# Patient Record
Sex: Male | Born: 1980 | Hispanic: Yes | Marital: Single | Smoking: Never smoker
Health system: Southern US, Community
[De-identification: ages and names within clinical notes are randomized; demographics above are authoritative.]

---

## 2019-10-18 ENCOUNTER — Emergency Department (HOSPITAL_COMMUNITY): Payer: Self-pay

## 2019-10-18 ENCOUNTER — Other Ambulatory Visit: Payer: Self-pay

## 2019-10-18 ENCOUNTER — Emergency Department (HOSPITAL_COMMUNITY)
Admission: EM | Admit: 2019-10-18 | Discharge: 2019-10-19 | Disposition: A | Discharge status: Home / Self Care | Payer: Self-pay | Attending: Emergency Medicine | Admitting: Emergency Medicine

## 2019-10-18 DIAGNOSIS — K863 Pseudocyst of pancreas: Secondary | ICD-10-CM | POA: Insufficient documentation

## 2019-10-18 DIAGNOSIS — Y999 Unspecified external cause status: Secondary | ICD-10-CM | POA: Insufficient documentation

## 2019-10-18 DIAGNOSIS — F10920 Alcohol use, unspecified with intoxication, uncomplicated: Secondary | ICD-10-CM | POA: Insufficient documentation

## 2019-10-18 DIAGNOSIS — R000 Tachycardia, unspecified: Secondary | ICD-10-CM | POA: Insufficient documentation

## 2019-10-18 DIAGNOSIS — Y939 Activity, unspecified: Secondary | ICD-10-CM | POA: Insufficient documentation

## 2019-10-18 DIAGNOSIS — Y908 Blood alcohol level of 240 mg/100 ml or more: Secondary | ICD-10-CM | POA: Insufficient documentation

## 2019-10-18 DIAGNOSIS — S0181XA Laceration without foreign body of other part of head, initial encounter: Secondary | ICD-10-CM | POA: Insufficient documentation

## 2019-10-18 DIAGNOSIS — R519 Headache, unspecified: Secondary | ICD-10-CM | POA: Insufficient documentation

## 2019-10-18 DIAGNOSIS — Z23 Encounter for immunization: Secondary | ICD-10-CM | POA: Insufficient documentation

## 2019-10-18 DIAGNOSIS — M542 Cervicalgia: Secondary | ICD-10-CM | POA: Insufficient documentation

## 2019-10-18 DIAGNOSIS — Y929 Unspecified place or not applicable: Secondary | ICD-10-CM | POA: Insufficient documentation

## 2019-10-18 LAB — I-STAT CHEM 8, ED
BUN: 6 mg/dL (ref 6–20)
Calcium, Ion: 0.93 mmol/L — ABNORMAL LOW (ref 1.15–1.40)
Chloride: 95 mmol/L — ABNORMAL LOW (ref 98–111)
Creatinine, Ser: 1.3 mg/dL — ABNORMAL HIGH (ref 0.61–1.24)
Glucose, Bld: 410 mg/dL — ABNORMAL HIGH (ref 70–99)
HCT: 47 % (ref 39.0–52.0)
Hemoglobin: 16 g/dL (ref 13.0–17.0)
Potassium: 3.8 mmol/L (ref 3.5–5.1)
Sodium: 132 mmol/L — ABNORMAL LOW (ref 135–145)
TCO2: 24 mmol/L (ref 22–32)

## 2019-10-18 LAB — COMPREHENSIVE METABOLIC PANEL
ALT: 71 U/L — ABNORMAL HIGH (ref 0–44)
AST: 95 U/L — ABNORMAL HIGH (ref 15–41)
Albumin: 4.5 g/dL (ref 3.5–5.0)
Alkaline Phosphatase: 81 U/L (ref 38–126)
Anion gap: 21 — ABNORMAL HIGH (ref 5–15)
BUN: 7 mg/dL (ref 6–20)
CO2: 21 mmol/L — ABNORMAL LOW (ref 22–32)
Calcium: 9 mg/dL (ref 8.9–10.3)
Chloride: 91 mmol/L — ABNORMAL LOW (ref 98–111)
Creatinine, Ser: 0.83 mg/dL (ref 0.61–1.24)
GFR calc Af Amer: >60 mL/min (ref >60)
GFR calc non Af Amer: >60 mL/min (ref >60)
Glucose, Bld: 413 mg/dL — ABNORMAL HIGH (ref 70–99)
Potassium: 4 mmol/L (ref 3.5–5.1)
Sodium: 133 mmol/L — ABNORMAL LOW (ref 135–145)
Total Bilirubin: 0.8 mg/dL (ref 0.3–1.2)
Total Protein: 7.4 g/dL (ref 6.5–8.1)

## 2019-10-18 LAB — CBC WITH DIFFERENTIAL/PLATELET
Abs Immature Granulocytes: 0.04 10*3/uL (ref 0.00–0.07)
Basophils Absolute: 0 10*3/uL (ref 0.0–0.1)
Basophils Relative: 1 %
Eosinophils Absolute: 0 10*3/uL (ref 0.0–0.5)
Eosinophils Relative: 0 %
HCT: 44.7 % (ref 39.0–52.0)
Hemoglobin: 15.4 g/dL (ref 13.0–17.0)
Immature Granulocytes: 1 %
Lymphocytes Relative: 41 %
Lymphs Abs: 2.1 10*3/uL (ref 0.7–4.0)
MCH: 31.3 pg (ref 26.0–34.0)
MCHC: 34.5 g/dL (ref 30.0–36.0)
MCV: 90.9 fL (ref 80.0–100.0)
Monocytes Absolute: 0.4 10*3/uL (ref 0.1–1.0)
Monocytes Relative: 7 %
Neutro Abs: 2.6 10*3/uL (ref 1.7–7.7)
Neutrophils Relative %: 50 %
Platelets: 147 10*3/uL — ABNORMAL LOW (ref 150–400)
RBC: 4.92 MIL/uL (ref 4.22–5.81)
RDW: 12.8 % (ref 11.5–15.5)
WBC: 5.1 10*3/uL (ref 4.0–10.5)
nRBC: 0 % (ref 0.0–0.2)

## 2019-10-18 LAB — URINALYSIS, ROUTINE W REFLEX MICROSCOPIC
Bacteria, UA: NONE SEEN
Bilirubin Urine: NEGATIVE
Glucose, UA: >=500 mg/dL — AB
Ketones, ur: 5 mg/dL — AB
Leukocytes,Ua: NEGATIVE
Nitrite: NEGATIVE
Protein, ur: 30 mg/dL — AB
Specific Gravity, Urine: 1.026 (ref 1.005–1.030)
pH: 6 (ref 5.0–8.0)

## 2019-10-18 LAB — ABO/RH: ABO/RH(D): A POS

## 2019-10-18 LAB — RAPID URINE DRUG SCREEN, HOSP PERFORMED
Amphetamines: NOT DETECTED
Barbiturates: NOT DETECTED
Benzodiazepines: NOT DETECTED
Cocaine: NOT DETECTED
Opiates: NOT DETECTED
Tetrahydrocannabinol: NOT DETECTED

## 2019-10-18 LAB — PROTIME-INR
INR: 0.9 (ref 0.8–1.2)
Prothrombin Time: 12.5 seconds (ref 11.4–15.2)

## 2019-10-18 LAB — TYPE AND SCREEN
ABO/RH(D): A POS
Antibody Screen: NEGATIVE

## 2019-10-18 LAB — ETHANOL: Alcohol, Ethyl (B): 294 mg/dL — ABNORMAL HIGH (ref <10)

## 2019-10-18 LAB — APTT: aPTT: 26 seconds (ref 24–36)

## 2019-10-18 MED ORDER — LORAZEPAM 2 MG/ML IJ SOLN
1.0000 mg | Freq: Once | INTRAMUSCULAR | Status: AC
  Administered 2019-10-18: 1 mg via INTRAVENOUS
  Filled 2019-10-18: qty 1

## 2019-10-18 MED ORDER — LIDOCAINE-EPINEPHRINE (PF) 2 %-1:200000 IJ SOLN
20.0000 mL | Freq: Once | INTRAMUSCULAR | Status: AC
  Administered 2019-10-18: 20 mL
  Filled 2019-10-18: qty 20

## 2019-10-18 MED ORDER — SODIUM CHLORIDE 0.9 % IV BOLUS
1000.0000 mL | Freq: Once | INTRAVENOUS | Status: AC
  Administered 2019-10-18: 1000 mL via INTRAVENOUS

## 2019-10-18 MED ORDER — IOHEXOL 300 MG/ML  SOLN
100.0000 mL | Freq: Once | INTRAMUSCULAR | Status: AC | PRN
  Administered 2019-10-18: 100 mL via INTRAVENOUS

## 2019-10-18 MED ORDER — LACTATED RINGERS IV BOLUS
1000.0000 mL | Freq: Once | INTRAVENOUS | Status: AC
  Administered 2019-10-18: 1000 mL via INTRAVENOUS

## 2019-10-18 MED ORDER — TETANUS-DIPHTH-ACELL PERTUSSIS 5-2.5-18.5 LF-MCG/0.5 IM SUSP
0.5000 mL | Freq: Once | INTRAMUSCULAR | Status: AC
  Administered 2019-10-18: 0.5 mL via INTRAMUSCULAR
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
## 2019-10-18 NOTE — ED Notes (Signed)
Pt found attempting to walk out of ED. Redirected to room
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
## 2019-10-18 NOTE — ED Notes (Signed)
TRN Note --pt transported to CT scan,

## 2019-10-19 MED ORDER — BACITRACIN ZINC 500 UNIT/GM EX OINT: TOPICAL_OINTMENT | Freq: Two times a day (BID) | CUTANEOUS | Status: DC

## 2019-10-19 NOTE — Discharge Instructions (Signed)
You will need your sutures removed in 7 to 10 days. 

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
## 2019-11-07 ENCOUNTER — Emergency Department (HOSPITAL_COMMUNITY)
Admission: EM | Admit: 2019-11-07 | Discharge: 2019-11-07 | Disposition: A | Discharge status: Home / Self Care | Payer: HRSA Program | Attending: Emergency Medicine | Admitting: Emergency Medicine

## 2019-11-07 ENCOUNTER — Encounter (HOSPITAL_COMMUNITY): Payer: Self-pay | Admitting: Emergency Medicine

## 2019-11-07 ENCOUNTER — Other Ambulatory Visit: Payer: Self-pay

## 2019-11-07 ENCOUNTER — Emergency Department (HOSPITAL_COMMUNITY): Payer: HRSA Program

## 2019-11-07 DIAGNOSIS — R7989 Other specified abnormal findings of blood chemistry: Secondary | ICD-10-CM | POA: Diagnosis not present

## 2019-11-07 DIAGNOSIS — U071 COVID-19: Secondary | ICD-10-CM | POA: Insufficient documentation

## 2019-11-07 DIAGNOSIS — R739 Hyperglycemia, unspecified: Secondary | ICD-10-CM

## 2019-11-07 DIAGNOSIS — R42 Dizziness and giddiness: Secondary | ICD-10-CM | POA: Diagnosis present

## 2019-11-07 LAB — URINALYSIS, ROUTINE W REFLEX MICROSCOPIC
Bacteria, UA: NONE SEEN
Bilirubin Urine: NEGATIVE
Glucose, UA: >=500 mg/dL — AB
Hgb urine dipstick: NEGATIVE
Ketones, ur: NEGATIVE mg/dL
Leukocytes,Ua: NEGATIVE
Nitrite: NEGATIVE
Protein, ur: 30 mg/dL — AB
Specific Gravity, Urine: 1.014 (ref 1.005–1.030)
pH: 6 (ref 5.0–8.0)

## 2019-11-07 LAB — CBC WITH DIFFERENTIAL/PLATELET
Abs Immature Granulocytes: 0.02 10*3/uL (ref 0.00–0.07)
Basophils Absolute: 0 10*3/uL (ref 0.0–0.1)
Basophils Relative: 0 %
Eosinophils Absolute: 0 10*3/uL (ref 0.0–0.5)
Eosinophils Relative: 0 %
HCT: 46 % (ref 39.0–52.0)
Hemoglobin: 15.8 g/dL (ref 13.0–17.0)
Immature Granulocytes: 1 %
Lymphocytes Relative: 41 %
Lymphs Abs: 1.7 10*3/uL (ref 0.7–4.0)
MCH: 31.1 pg (ref 26.0–34.0)
MCHC: 34.3 g/dL (ref 30.0–36.0)
MCV: 90.6 fL (ref 80.0–100.0)
Monocytes Absolute: 0.3 10*3/uL (ref 0.1–1.0)
Monocytes Relative: 8 %
Neutro Abs: 2.1 10*3/uL (ref 1.7–7.7)
Neutrophils Relative %: 50 %
Platelets: 193 10*3/uL (ref 150–400)
RBC: 5.08 MIL/uL (ref 4.22–5.81)
RDW: 12.3 % (ref 11.5–15.5)
WBC: 4.1 10*3/uL (ref 4.0–10.5)
nRBC: 0 % (ref 0.0–0.2)

## 2019-11-07 LAB — COMPREHENSIVE METABOLIC PANEL
ALT: 80 U/L — ABNORMAL HIGH (ref 0–44)
AST: 46 U/L — ABNORMAL HIGH (ref 15–41)
Albumin: 4 g/dL (ref 3.5–5.0)
Alkaline Phosphatase: 78 U/L (ref 38–126)
Anion gap: 15 (ref 5–15)
BUN: 8 mg/dL (ref 6–20)
CO2: 22 mmol/L (ref 22–32)
Calcium: 9 mg/dL (ref 8.9–10.3)
Chloride: 98 mmol/L (ref 98–111)
Creatinine, Ser: 0.64 mg/dL (ref 0.61–1.24)
GFR calc Af Amer: >60 mL/min (ref >60)
GFR calc non Af Amer: >60 mL/min (ref >60)
Glucose, Bld: 281 mg/dL — ABNORMAL HIGH (ref 70–99)
Potassium: 3.2 mmol/L — ABNORMAL LOW (ref 3.5–5.1)
Sodium: 135 mmol/L (ref 135–145)
Total Bilirubin: 0.8 mg/dL (ref 0.3–1.2)
Total Protein: 7.2 g/dL (ref 6.5–8.1)

## 2019-11-07 LAB — LACTIC ACID, PLASMA
Lactic Acid, Venous: 1.9 mmol/L (ref 0.5–1.9)
Lactic Acid, Venous: 1.2 mmol/L (ref 0.5–1.9)

## 2019-11-07 LAB — TROPONIN I (HIGH SENSITIVITY)
Troponin I (High Sensitivity): 3 ng/L (ref <18)
Troponin I (High Sensitivity): 4 ng/L (ref <18)

## 2019-11-07 LAB — POC SARS CORONAVIRUS 2 AG -  ED: SARS Coronavirus 2 Ag: POSITIVE — AB

## 2019-11-07 MED ORDER — ACETAMINOPHEN 325 MG PO TABS
650.0000 mg | ORAL_TABLET | Freq: Once | ORAL | Status: AC | PRN
  Administered 2019-11-07: 650 mg via ORAL
  Filled 2019-11-07: qty 2

## 2019-11-07 MED ORDER — SODIUM CHLORIDE 0.9% FLUSH: 3.0000 mL | Freq: Once | INTRAVENOUS | Status: DC

## 2019-11-07 MED ORDER — ACETAMINOPHEN 325 MG PO TABS
325.0000 mg | ORAL_TABLET | Freq: Once | ORAL | Status: AC
  Administered 2019-11-07: 325 mg via ORAL
  Filled 2019-11-07: qty 1

## 2019-11-07 NOTE — ED Triage Notes (Signed)
C/o dizziness, headache, cough, and generalized chest pain x 1 week.  States he has been taking cold medication without relief.

## 2019-11-07 NOTE — ED Notes (Signed)
Pt ambulated in room. Oxygen saturation levels remain between 95-97%. Pt did not complain of dizziness. Pt is in bed.

## 2019-11-07 NOTE — ED Notes (Signed)
POC COVID result POSITIVE reported to Dr. Salena Saner. Tag[place].

## 2019-11-07 NOTE — Discharge Instructions (Addendum)
You were seen in the ER for chest pain, cough and shortness of breath.  You tested positive for COVID-19 in the ER.  It is important that you quarantine for at least 7 to 10 days to avoid the spread of COVID-19.  Make sure to drink lots of fluids, use over-the-counter Tylenol for fevers and body aches.  Return to the ER if your having increased shortness of breath, worsening fevers, chest pain or your symptoms get worse.  You may buy a over the counter pulse oximeter to measure your oxygen levels which can be found at your local pharmacy.  Your lab work also showed elevated blood sugars.  It also showed elevated liver function tests.  It is important that you follow-up with a family doctor about this.  I provided the phone number and address to [facility] which sees uninsured patients.  It is important that you are seen for this.

## 2019-11-07 NOTE — ED Notes (Signed)
Patient verbalizes understanding of discharge instructions. Opportunity for questioning and answers were provided. Armband removed by staff, pt discharged from ED ambulatory.   

## 2019-11-08 LAB — SARS CORONAVIRUS 2 (TAT 6-24 HRS): SARS Coronavirus 2: POSITIVE — AB

## 2019-11-08 NOTE — ED Provider Notes (Signed)
[place] EMERGENCY DEPARTMENT Provider Note   CSN: [id number] Arrival date & time: 11/07/19  1520     History Chief Complaint  Patient presents with  . Dizziness  . Headache  . Cough    Tanner Cooper is a 39 y.o. male.  HPI 39 year old male with no significant medical history presents to the ER for 1 week history of productive cough, chest pain, headache, fever, chills.  Reports loss of taste.  Patient states he works at [employer], unclear if he had any known Covid exposures.  Denies any radiating chest pain, syncope, diaphoresis, abdominal pain, back pain, history of cardiac problems, unilateral weakness, neck stiffness, vision changes.  Patient denies any knowledge of diabetes, although previous chart review states that he reported that he does have diabetes.  Reports taking over-the-counter cold medicine with no relief.    History reviewed. No pertinent past medical history.  There are no problems to display for this patient.   History reviewed. No pertinent surgical history.     No family history on file.  Social History   Tobacco Use  . Smoking status: Never Smoker  . Smokeless tobacco: Never Used  Substance Use Topics  . Alcohol use: Not Currently  . Drug use: Not Currently    Home Medications Prior to Admission medications   Not on File    Allergies    Patient has no known allergies.  Review of Systems   Review of Systems  Constitutional: Positive for chills, fatigue and fever.  HENT: Negative for ear pain, rhinorrhea, sore throat and trouble swallowing.   Eyes: Negative for pain and visual disturbance.  Respiratory: Positive for cough and shortness of breath.   Cardiovascular: Positive for chest pain. Negative for palpitations and leg swelling.  Gastrointestinal: Negative for abdominal pain and vomiting.  Genitourinary: Negative for dysuria and hematuria.  Musculoskeletal: Negative for arthralgias and back pain.  Skin: Negative  for color change and rash.  Neurological: Negative for seizures and syncope.  All other systems reviewed and are negative.   Physical Exam Updated Vital Signs BP (!) 137/114   Pulse (!) 109   Temp (!) 101.6 F (38.7 C) (Oral)   Resp 17   SpO2 97%   Physical Exam Vitals reviewed.  Constitutional:      Appearance: Normal appearance. He is well-developed.  HENT:     Head: Normocephalic and atraumatic.  Eyes:     General:        Right eye: No discharge.        Left eye: No discharge.     Extraocular Movements: Extraocular movements intact.     Conjunctiva/sclera: Conjunctivae normal.  Cardiovascular:     Rate and Rhythm: Regular rhythm. Tachycardia present.     Heart sounds: Normal heart sounds.  Pulmonary:     Effort: Pulmonary effort is normal.     Breath sounds: Normal breath sounds.  Abdominal:     General: Bowel sounds are normal.     Palpations: Abdomen is soft.  Musculoskeletal:        General: No swelling. Normal range of motion.     Cervical back: Normal range of motion and neck supple.  Skin:    General: Skin is warm and dry.  Neurological:     General: No focal deficit present.     Mental Status: He is alert and oriented to person, place, and time.  Psychiatric:        Mood and Affect: Mood normal.  Behavior: Behavior normal.     ED Results / Procedures / Treatments   Labs (all labs ordered are listed, but only abnormal results are displayed) Labs Reviewed  COMPREHENSIVE METABOLIC PANEL - Abnormal; Notable for the following components:      Result Value   Potassium 3.2 (*)    Glucose, Bld 281 (*)    AST 46 (*)    ALT 80 (*)    All other components within normal limits  URINALYSIS, ROUTINE W REFLEX MICROSCOPIC - Abnormal; Notable for the following components:   Glucose, UA >=500 (*)    Protein, ur 30 (*)    All other components within normal limits  POC SARS CORONAVIRUS 2 AG -  ED - Abnormal; Notable for the following components:   SARS  Coronavirus 2 Ag POSITIVE (*)    All other components within normal limits  SARS CORONAVIRUS 2 (TAT 6-24 HRS)  LACTIC ACID, PLASMA  LACTIC ACID, PLASMA  CBC WITH DIFFERENTIAL/PLATELET  TROPONIN I (HIGH SENSITIVITY)  TROPONIN I (HIGH SENSITIVITY)    EKG EKG Interpretation  Date/Time:  Saturday November 07 2019 15:37:23 EDT Ventricular Rate:  135 PR Interval:  130 QRS Duration: 86 QT Interval:  302 QTC Calculation: 453 R Axis:   45 Text Interpretation: Sinus tachycardia Possible Inferior infarct , age undetermined Cannot rule out Anterior infarct , age undetermined Abnormal ECG When compared to prior, similar sinus tachycardia \ No STEMI Confirmed by Theda Belfast ([id number]) on 11/07/2019 6:40:38 PM   Radiology DG Chest Portable 1 View  Result Date: 11/07/2019 CLINICAL DATA:  Cough, short of breath EXAM: PORTABLE CHEST 1 VIEW COMPARISON:  10/18/2019 FINDINGS: Single frontal view of the chest demonstrates an unremarkable cardiac silhouette. Lung volumes are diminished with crowding of the central vasculature. There is patchy bibasilar airspace disease consistent with edema or pneumonia. No effusion or pneumothorax. No acute bony abnormality. IMPRESSION: 1. Low lung volumes, with superimposed bibasilar airspace disease. Appearance could reflect edema or infection. Electronically Signed   By: Sharlet Salina M.D.   On: 11/07/2019 20:33    Procedures Procedures (including critical care time)  Medications Ordered in ED Medications  sodium chloride flush (NS) 0.9 % injection 3 mL (has no administration in time range)  sodium chloride flush (NS) 0.9 % injection 3 mL (has no administration in time range)  acetaminophen (TYLENOL) tablet 650 mg (650 mg Oral Given 11/07/19 1547)  acetaminophen (TYLENOL) tablet 325 mg (325 mg Oral Given 11/07/19 2132)    ED Course  I have reviewed the triage vital signs and the nursing notes.  Pertinent labs & imaging results that were available during my care of  the patient were reviewed by me and considered in my medical decision making (see chart for details).    MDM Rules/Calculators/A&P                     39 year old male with a 1 week history of fever, cough, chest pain.  On presentation to the ER, patient is alert and oriented, no acute distress, not toxic appearing, speaking full sentences without increased work of breathing.  Patient initially tachycardic on presentation to the ER, but this has improved through ED course.  Patient febrile with a temperature over 101.6.  Hypertensive, though this appears to be baseline based on previous readings.  Oxygen saturations 97%.  CBC without leukocytosis, CMP with very mild hypokalemia but I do not think this is clinically significant.  Glucose elevated at 281 but this is  lower than his previous readings.  AST and ALT elevated, but also lower than previous results.  Patient denies any abdominal symptoms. Pt seen previously in the ED for alcohol intoxication.  UA with more than 500 glucose, but without evidence of UTI.  When inquiring if the patient is aware that he has elevated blood sugars, patient denies this but based on previous chart review, it appears that the patient had reported that he had diabetes.  Point of care care Covid test positive.  Chest x-ray shows low lung volumes with superimposed bibasilar airspace disease, could reflect edema or infection.  Likely consistent in the setting of Covid.  EKG unchanged from previous. Serial troponins negative.   Patient received Tylenol in the ED.  Ambulated around the room with no evidence of desaturations.  Overall patient is well-appearing, expresses desire to go home.  I do not think that hospitalization is indicated at this time.  Given initial tachycardia, there was concern for possible PE, but patient saturations are reassuring and tachycardia improved through ED course.  Discussed case with Dr. Rush Landmark, do not think that CTA is indicated at this time.   Patient to be discharged with supportive care, OTC Tylenol for fever, drinking lots of fluids.  Stressed importance of quarantine.  Stressed followup with PCP for blood sugars and elevated liver enzymes. Provided  and Wellness contact. Strict return precautions given.  Patient voices understanding is agreeable to this plan.  At this stage in the ED course, patient has been adequately screened and is stable for discharge.   Final Clinical Impression(s) / ED Diagnoses Final diagnoses:  COVID-19  Elevated blood sugar  Elevated LFTs    Rx / DC Orders ED Discharge Orders    None       Leone Brand 11/08/19 0303    Tegeler, Canary Brim, MD 11/09/19 [phone]
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
## 2024-02-23 ENCOUNTER — Emergency Department (HOSPITAL_COMMUNITY): Payer: Self-pay

## 2024-02-23 ENCOUNTER — Other Ambulatory Visit: Payer: Self-pay

## 2024-02-23 ENCOUNTER — Emergency Department (HOSPITAL_COMMUNITY)
Admission: EM | Admit: 2024-02-23 | Discharge: 2024-02-23 | Disposition: A | Discharge status: Home / Self Care | Payer: Self-pay | Attending: Emergency Medicine | Admitting: Emergency Medicine

## 2024-02-23 DIAGNOSIS — F10129 Alcohol abuse with intoxication, unspecified: Secondary | ICD-10-CM | POA: Insufficient documentation

## 2024-02-23 DIAGNOSIS — F10920 Alcohol use, unspecified with intoxication, uncomplicated: Secondary | ICD-10-CM

## 2024-02-23 DIAGNOSIS — R7401 Elevation of levels of liver transaminase levels: Secondary | ICD-10-CM | POA: Insufficient documentation

## 2024-02-23 DIAGNOSIS — Y908 Blood alcohol level of 240 mg/100 ml or more: Secondary | ICD-10-CM | POA: Insufficient documentation

## 2024-02-23 DIAGNOSIS — K2920 Alcoholic gastritis without bleeding: Secondary | ICD-10-CM | POA: Insufficient documentation

## 2024-02-23 LAB — COMPREHENSIVE METABOLIC PANEL WITH GFR
ALT: 178 U/L — ABNORMAL HIGH (ref 0–44)
AST: 256 U/L — ABNORMAL HIGH (ref 15–41)
Albumin: 4.4 g/dL (ref 3.5–5.0)
Alkaline Phosphatase: 115 U/L (ref 38–126)
Anion gap: 18 — ABNORMAL HIGH (ref 5–15)
BUN: 6 mg/dL (ref 6–20)
CO2: 24 mmol/L (ref 22–32)
Calcium: 8.8 mg/dL — ABNORMAL LOW (ref 8.9–10.3)
Chloride: 90 mmol/L — ABNORMAL LOW (ref 98–111)
Creatinine, Ser: 0.37 mg/dL — ABNORMAL LOW (ref 0.61–1.24)
GFR, Estimated: >60 mL/min (ref >60)
Glucose, Bld: 262 mg/dL — ABNORMAL HIGH (ref 70–99)
Potassium: 3.8 mmol/L (ref 3.5–5.1)
Sodium: 132 mmol/L — ABNORMAL LOW (ref 135–145)
Total Bilirubin: 0.9 mg/dL (ref 0.0–1.2)
Total Protein: 8 g/dL (ref 6.5–8.1)

## 2024-02-23 LAB — BLOOD GAS, VENOUS
Acid-Base Excess: 2.9 mmol/L — ABNORMAL HIGH (ref 0.0–2.0)
Bicarbonate: 28.5 mmol/L — ABNORMAL HIGH (ref 20.0–28.0)
O2 Saturation: 88.4 %
Patient temperature: 37
pCO2, Ven: 46 mmHg (ref 44–60)
pH, Ven: 7.4 (ref 7.25–7.43)
pO2, Ven: 62 mmHg — ABNORMAL HIGH (ref 32–45)

## 2024-02-23 LAB — I-STAT CG4 LACTIC ACID, ED
Lactic Acid, Venous: 3.6 mmol/L (ref 0.5–1.9)
Lactic Acid, Venous: 3.6 mmol/L (ref 0.5–1.9)

## 2024-02-23 LAB — URINALYSIS, ROUTINE W REFLEX MICROSCOPIC
Bacteria, UA: NONE SEEN
Bilirubin Urine: NEGATIVE
Glucose, UA: >=500 mg/dL — AB
Hgb urine dipstick: NEGATIVE
Ketones, ur: NEGATIVE mg/dL
Leukocytes,Ua: NEGATIVE
Nitrite: NEGATIVE
Protein, ur: NEGATIVE mg/dL
Specific Gravity, Urine: 1.007 (ref 1.005–1.030)
pH: 6 (ref 5.0–8.0)

## 2024-02-23 LAB — CBC WITH DIFFERENTIAL/PLATELET
Abs Immature Granulocytes: 0.02 K/uL (ref 0.00–0.07)
Basophils Absolute: 0.1 K/uL (ref 0.0–0.1)
Basophils Relative: 1 %
Eosinophils Absolute: 0 K/uL (ref 0.0–0.5)
Eosinophils Relative: 1 %
HCT: 41.6 % (ref 39.0–52.0)
Hemoglobin: 13.9 g/dL (ref 13.0–17.0)
Immature Granulocytes: 0 %
Lymphocytes Relative: 29 %
Lymphs Abs: 1.8 K/uL (ref 0.7–4.0)
MCH: 32.3 pg (ref 26.0–34.0)
MCHC: 33.4 g/dL (ref 30.0–36.0)
MCV: 96.7 fL (ref 80.0–100.0)
Monocytes Absolute: 0.7 K/uL (ref 0.1–1.0)
Monocytes Relative: 11 %
Neutro Abs: 3.7 K/uL (ref 1.7–7.7)
Neutrophils Relative %: 58 %
Platelets: 169 K/uL (ref 150–400)
RBC: 4.3 MIL/uL (ref 4.22–5.81)
RDW: 14.5 % (ref 11.5–15.5)
WBC: 6.3 K/uL (ref 4.0–10.5)
nRBC: 0 % (ref 0.0–0.2)

## 2024-02-23 LAB — I-STAT CHEM 8, ED
BUN: 3 mg/dL — ABNORMAL LOW (ref 6–20)
Calcium, Ion: 0.94 mmol/L — ABNORMAL LOW (ref 1.15–1.40)
Chloride: 93 mmol/L — ABNORMAL LOW (ref 98–111)
Creatinine, Ser: 0.9 mg/dL (ref 0.61–1.24)
Glucose, Bld: 258 mg/dL — ABNORMAL HIGH (ref 70–99)
HCT: 45 % (ref 39.0–52.0)
Hemoglobin: 15.3 g/dL (ref 13.0–17.0)
Potassium: 4 mmol/L (ref 3.5–5.1)
Sodium: 132 mmol/L — ABNORMAL LOW (ref 135–145)
TCO2: 26 mmol/L (ref 22–32)

## 2024-02-23 LAB — CBG MONITORING, ED: Glucose-Capillary: 259 mg/dL — ABNORMAL HIGH (ref 70–99)

## 2024-02-23 LAB — LIPASE, BLOOD: Lipase: 84 U/L — ABNORMAL HIGH (ref 11–51)

## 2024-02-23 LAB — ETHANOL: Alcohol, Ethyl (B): 455 mg/dL (ref <15)

## 2024-02-23 MED ORDER — IOHEXOL 300 MG/ML  SOLN
100.0000 mL | Freq: Once | INTRAMUSCULAR | Status: AC | PRN
  Administered 2024-02-23: 100 mL via INTRAVENOUS

## 2024-02-23 MED ORDER — ONDANSETRON HCL 4 MG/2ML IJ SOLN
4.0000 mg | Freq: Once | INTRAMUSCULAR | Status: AC
  Administered 2024-02-23: 4 mg via INTRAVENOUS
  Filled 2024-02-23: qty 2

## 2024-02-23 MED ORDER — ONDANSETRON HCL 4 MG PO TABS
4.0000 mg | ORAL_TABLET | Freq: Four times a day (QID) | ORAL | 0 refills | Status: DC
Start: 2024-02-23 — End: 2024-04-14
  Filled 2024-02-23: qty 15, 4d supply, fill #0

## 2024-02-23 MED ORDER — SODIUM CHLORIDE 0.9 % IV BOLUS
1000.0000 mL | Freq: Once | INTRAVENOUS | Status: AC
  Administered 2024-02-23: 1000 mL via INTRAVENOUS

## 2024-02-23 MED ORDER — FAMOTIDINE IN NACL 20-0.9 MG/50ML-% IV SOLN
20.0000 mg | Freq: Once | INTRAVENOUS | Status: AC
  Administered 2024-02-23: 20 mg via INTRAVENOUS
  Filled 2024-02-23: qty 50

## 2024-02-23 MED ORDER — ESOMEPRAZOLE MAGNESIUM 40 MG PO CPDR
40.0000 mg | DELAYED_RELEASE_CAPSULE | Freq: Every day | ORAL | 0 refills | Status: DC
  Filled 2024-02-23: qty 30, 30d supply, fill #0

## 2024-02-23 NOTE — ED Provider Notes (Signed)
 [place] EMERGENCY DEPARTMENT AT [facility] Provider Note   CSN: [id number] Arrival date & time: 02/23/24  1458     Patient presents with: Fatigue and Emesis   Tanner Cooper is a 43 y.o. male history of alcohol abuse, here presenting with dizziness and blurry vision and abdominal pain and vomiting.  Patient states that he drinks 2-3 beers a day.  He states that for the last 2 to 3 days, he has been vomiting and unable to keep anything down.  Patient states that he has not been taking his insulin for several months due to lack of insurance and lack of money.  He has not been checking his sugars at home.  Patient has any fevers or cough.   The history is provided by the patient.       Prior to Admission medications   Not on File    Allergies: Patient has no known allergies.    Review of Systems  Gastrointestinal:  Positive for abdominal pain and vomiting.  All other systems reviewed and are negative.   Updated Vital Signs BP (!) 161/123 (BP Location: Right Arm)   Pulse (!) 102   Temp 98.7 F (37.1 C) (Oral)   Resp 18   SpO2 93%   Physical Exam Vitals and nursing note reviewed.  Constitutional:      Appearance: Normal appearance.  HENT:     Head: Normocephalic.     Nose: Nose normal.     Mouth/Throat:     Mouth: Mucous membranes are dry.  Eyes:     Extraocular Movements: Extraocular movements intact.     Pupils: Pupils are equal, round, and reactive to light.  Cardiovascular:     Rate and Rhythm: Normal rate and regular rhythm.     Pulses: Normal pulses.     Heart sounds: Normal heart sounds.  Pulmonary:     Effort: Pulmonary effort is normal.     Breath sounds: Normal breath sounds.  Abdominal:     General: Abdomen is flat.     Palpations: Abdomen is soft.     Comments: Mild epigastric tenderness  Musculoskeletal:        General: Normal range of motion.     Cervical back: Normal range of motion and neck supple.  Skin:    General: Skin is  warm.     Capillary Refill: Capillary refill takes less than 2 seconds.  Neurological:     General: No focal deficit present.     Mental Status: He is alert and oriented to person, place, and time.  Psychiatric:        Mood and Affect: Mood normal.        Behavior: Behavior normal.     (all labs ordered are listed, but only abnormal results are displayed) Labs Reviewed  CBG MONITORING, ED - Abnormal; Notable for the following components:      Result Value   Glucose-Capillary 259 (*)    All other components within normal limits  CBC WITH DIFFERENTIAL/PLATELET  COMPREHENSIVE METABOLIC PANEL WITH GFR  BLOOD GAS, VENOUS  ETHANOL  LIPASE, BLOOD  I-STAT CHEM 8, ED    EKG: None  Radiology: No results found.   Procedures   Medications Ordered in the ED  sodium chloride  0.9 % bolus 1,000 mL (1,000 mLs Intravenous New Bag/Given 02/23/24 1535)  ondansetron  (ZOFRAN ) injection 4 mg (4 mg Intravenous Given 02/23/24 1535)  Medical Decision Making Tanner Cooper is a 43 y.o. male who presented with alcohol intoxication and vomiting and blurry vision.  Consider pancreatitis versus alcoholic gastritis versus DKA.  Less likely to have intracranial bleeding causing his symptoms.  Plan to get CBC and CMP and VBG and CT head and CT abdomen pelvis.  Will hydrate and reassess  5 pm Alcohol level is 455.  LFTs are elevated.  Lactate is 3.6 likely from liver dysfunction from alcohol.  CT abdomen pelvis unremarkable CT head unremarkable.  Unfortunately patient has nowhere to go and has no money.  Patient has nobody to come and pick him up.  Will continue IV fluids  8:08 PM Patient's lactate still remain elevated at 3.6.  I think this is from his chronic liver dysfunction.  Patient is able to ambulate by himself.  Also tolerated oral fluids.  Will give Zofran  as needed and Nexium  as needed.  Messaged the case manager to get him follow-up and obtain  prescription  Problems Addressed: Acute alcoholic gastritis, presence of bleeding unspecified: acute illness or injury Alcoholic intoxication without complication (HCC): acute illness or injury  Amount and/or Complexity of Data Reviewed Labs: ordered. Decision-making details documented in ED Course. Radiology: ordered and independent interpretation performed. Decision-making details documented in ED Course.  Risk Prescription drug management.     Final diagnoses:  None    ED Discharge Orders     None          Patt Alm Macho, MD 02/23/24 2009

## 2024-02-23 NOTE — ED Triage Notes (Signed)
 BIBA from the street for generalized weakness, decreased oral intake, vomiting, has been out of insulin for unknown amount of time. Pt reports he had 2 large beers today. 108 HR 95% r/a 180/120 BP 246 cbg 97.8 T

## 2024-02-23 NOTE — ED Notes (Signed)
Pt given a turkey sandwich

## 2024-02-23 NOTE — Discharge Instructions (Addendum)
 Please avoid drinking alcohol.  You have gastritis from alcohol I recommend Zofran  as needed for nausea  Take Nexium  40 mg daily  I have contacted the case manager to help you establish primary care doctor and obtain your medicines  Follow-up at wellness clinic  Return to ER if you have worse abdominal pain or vomiting or dizziness or passing

## 2024-02-24 ENCOUNTER — Other Ambulatory Visit (HOSPITAL_COMMUNITY): Payer: Self-pay

## 2024-04-03 ENCOUNTER — Other Ambulatory Visit: Payer: Self-pay

## 2024-04-03 ENCOUNTER — Encounter (HOSPITAL_COMMUNITY): Payer: Self-pay

## 2024-04-03 ENCOUNTER — Inpatient Hospital Stay (HOSPITAL_COMMUNITY): Payer: MEDICAID

## 2024-04-03 ENCOUNTER — Emergency Department (HOSPITAL_COMMUNITY): Payer: MEDICAID

## 2024-04-03 ENCOUNTER — Inpatient Hospital Stay (HOSPITAL_COMMUNITY)
Admission: EM | Admit: 2024-04-03 | Discharge: 2024-04-14 | DRG: 432 | Disposition: A | Discharge status: Home / Self Care | Payer: MEDICAID | Attending: Family Medicine | Admitting: Family Medicine

## 2024-04-03 DIAGNOSIS — D168 Benign neoplasm of pelvic bones, sacrum and coccyx: Secondary | ICD-10-CM | POA: Diagnosis present

## 2024-04-03 DIAGNOSIS — K7011 Alcoholic hepatitis with ascites: Principal | ICD-10-CM | POA: Diagnosis present

## 2024-04-03 DIAGNOSIS — K3189 Other diseases of stomach and duodenum: Secondary | ICD-10-CM | POA: Diagnosis present

## 2024-04-03 DIAGNOSIS — Z560 Unemployment, unspecified: Secondary | ICD-10-CM

## 2024-04-03 DIAGNOSIS — R1110 Vomiting, unspecified: Secondary | ICD-10-CM

## 2024-04-03 DIAGNOSIS — K2289 Other specified disease of esophagus: Secondary | ICD-10-CM | POA: Diagnosis present

## 2024-04-03 DIAGNOSIS — K921 Melena: Secondary | ICD-10-CM

## 2024-04-03 DIAGNOSIS — Z638 Other specified problems related to primary support group: Secondary | ICD-10-CM

## 2024-04-03 DIAGNOSIS — B9681 Helicobacter pylori [H. pylori] as the cause of diseases classified elsewhere: Secondary | ICD-10-CM | POA: Diagnosis present

## 2024-04-03 DIAGNOSIS — I8511 Secondary esophageal varices with bleeding: Secondary | ICD-10-CM | POA: Diagnosis present

## 2024-04-03 DIAGNOSIS — E876 Hypokalemia: Secondary | ICD-10-CM | POA: Diagnosis present

## 2024-04-03 DIAGNOSIS — E861 Hypovolemia: Secondary | ICD-10-CM | POA: Diagnosis present

## 2024-04-03 DIAGNOSIS — Y908 Blood alcohol level of 240 mg/100 ml or more: Secondary | ICD-10-CM | POA: Diagnosis present

## 2024-04-03 DIAGNOSIS — I8510 Secondary esophageal varices without bleeding: Secondary | ICD-10-CM

## 2024-04-03 DIAGNOSIS — K766 Portal hypertension: Secondary | ICD-10-CM | POA: Diagnosis present

## 2024-04-03 DIAGNOSIS — Z91199 Patient's noncompliance with other medical treatment and regimen due to unspecified reason: Secondary | ICD-10-CM

## 2024-04-03 DIAGNOSIS — F1010 Alcohol abuse, uncomplicated: Secondary | ICD-10-CM

## 2024-04-03 DIAGNOSIS — Z79899 Other long term (current) drug therapy: Secondary | ICD-10-CM

## 2024-04-03 DIAGNOSIS — E8770 Fluid overload, unspecified: Secondary | ICD-10-CM | POA: Diagnosis present

## 2024-04-03 DIAGNOSIS — K7460 Unspecified cirrhosis of liver: Secondary | ICD-10-CM | POA: Diagnosis present

## 2024-04-03 DIAGNOSIS — Z7984 Long term (current) use of oral hypoglycemic drugs: Secondary | ICD-10-CM

## 2024-04-03 DIAGNOSIS — K7200 Acute and subacute hepatic failure without coma: Secondary | ICD-10-CM | POA: Diagnosis present

## 2024-04-03 DIAGNOSIS — I959 Hypotension, unspecified: Secondary | ICD-10-CM | POA: Diagnosis present

## 2024-04-03 DIAGNOSIS — K7581 Nonalcoholic steatohepatitis (NASH): Secondary | ICD-10-CM | POA: Diagnosis present

## 2024-04-03 DIAGNOSIS — K219 Gastro-esophageal reflux disease without esophagitis: Secondary | ICD-10-CM | POA: Diagnosis present

## 2024-04-03 DIAGNOSIS — E0965 Drug or chemical induced diabetes mellitus with hyperglycemia: Secondary | ICD-10-CM | POA: Diagnosis present

## 2024-04-03 DIAGNOSIS — K2970 Gastritis, unspecified, without bleeding: Secondary | ICD-10-CM | POA: Diagnosis present

## 2024-04-03 DIAGNOSIS — Z5900 Homelessness unspecified: Secondary | ICD-10-CM

## 2024-04-03 DIAGNOSIS — T380X5A Adverse effect of glucocorticoids and synthetic analogues, initial encounter: Secondary | ICD-10-CM | POA: Diagnosis present

## 2024-04-03 DIAGNOSIS — J9811 Atelectasis: Secondary | ICD-10-CM | POA: Diagnosis not present

## 2024-04-03 DIAGNOSIS — Z5971 Insufficient health insurance coverage: Secondary | ICD-10-CM

## 2024-04-03 DIAGNOSIS — Z5986 Financial insecurity: Secondary | ICD-10-CM

## 2024-04-03 DIAGNOSIS — R1011 Right upper quadrant pain: Secondary | ICD-10-CM

## 2024-04-03 DIAGNOSIS — E871 Hypo-osmolality and hyponatremia: Principal | ICD-10-CM | POA: Diagnosis present

## 2024-04-03 DIAGNOSIS — R531 Weakness: Secondary | ICD-10-CM

## 2024-04-03 DIAGNOSIS — K7010 Alcoholic hepatitis without ascites: Secondary | ICD-10-CM | POA: Diagnosis present

## 2024-04-03 DIAGNOSIS — Z603 Acculturation difficulty: Secondary | ICD-10-CM | POA: Diagnosis present

## 2024-04-03 LAB — I-STAT CHEM 8, ED
BUN: <3 mg/dL — ABNORMAL LOW (ref 6–20)
Calcium, Ion: 0.92 mmol/L — ABNORMAL LOW (ref 1.15–1.40)
Chloride: 85 mmol/L — ABNORMAL LOW (ref 98–111)
Creatinine, Ser: 0.7 mg/dL (ref 0.61–1.24)
Glucose, Bld: 184 mg/dL — ABNORMAL HIGH (ref 70–99)
HCT: 47 % (ref 39.0–52.0)
Hemoglobin: 16 g/dL (ref 13.0–17.0)
Potassium: 4.2 mmol/L (ref 3.5–5.1)
Sodium: 121 mmol/L — ABNORMAL LOW (ref 135–145)
TCO2: 22 mmol/L (ref 22–32)

## 2024-04-03 LAB — RAPID URINE DRUG SCREEN, HOSP PERFORMED
Amphetamines: NOT DETECTED
Barbiturates: NOT DETECTED
Benzodiazepines: NOT DETECTED
Cocaine: NOT DETECTED
Opiates: NOT DETECTED
Tetrahydrocannabinol: NOT DETECTED

## 2024-04-03 LAB — CBC WITH DIFFERENTIAL/PLATELET
Abs Immature Granulocytes: 0.15 K/uL — ABNORMAL HIGH (ref 0.00–0.07)
Basophils Absolute: 0.1 K/uL (ref 0.0–0.1)
Basophils Relative: 1 %
Eosinophils Absolute: 0 K/uL (ref 0.0–0.5)
Eosinophils Relative: 0 %
HCT: 38.7 % — ABNORMAL LOW (ref 39.0–52.0)
Hemoglobin: 13.6 g/dL (ref 13.0–17.0)
Immature Granulocytes: 2 %
Lymphocytes Relative: 5 %
Lymphs Abs: 0.5 K/uL — ABNORMAL LOW (ref 0.7–4.0)
MCH: 33.9 pg (ref 26.0–34.0)
MCHC: 35.1 g/dL (ref 30.0–36.0)
MCV: 96.5 fL (ref 80.0–100.0)
Monocytes Absolute: 0.9 K/uL (ref 0.1–1.0)
Monocytes Relative: 9 %
Neutro Abs: 8.6 K/uL — ABNORMAL HIGH (ref 1.7–7.7)
Neutrophils Relative %: 83 %
Platelets: 182 K/uL (ref 150–400)
RBC: 4.01 MIL/uL — ABNORMAL LOW (ref 4.22–5.81)
RDW: 14 % (ref 11.5–15.5)
WBC: 10.3 K/uL (ref 4.0–10.5)
nRBC: 0 % (ref 0.0–0.2)

## 2024-04-03 LAB — COMPREHENSIVE METABOLIC PANEL WITH GFR
ALT: 97 U/L — ABNORMAL HIGH (ref 0–44)
ALT: 87 U/L — ABNORMAL HIGH (ref 0–44)
AST: 306 U/L — ABNORMAL HIGH (ref 15–41)
AST: 278 U/L — ABNORMAL HIGH (ref 15–41)
Albumin: 2.3 g/dL — ABNORMAL LOW (ref 3.5–5.0)
Albumin: 2.5 g/dL — ABNORMAL LOW (ref 3.5–5.0)
Alkaline Phosphatase: 623 U/L — ABNORMAL HIGH (ref 38–126)
Alkaline Phosphatase: 570 U/L — ABNORMAL HIGH (ref 38–126)
Anion gap: 13 (ref 5–15)
Anion gap: 14 (ref 5–15)
BUN: <5 mg/dL — ABNORMAL LOW (ref 6–20)
BUN: <5 mg/dL — ABNORMAL LOW (ref 6–20)
CO2: 22 mmol/L (ref 22–32)
CO2: 21 mmol/L — ABNORMAL LOW (ref 22–32)
Calcium: 7.6 mg/dL — ABNORMAL LOW (ref 8.9–10.3)
Calcium: 7.7 mg/dL — ABNORMAL LOW (ref 8.9–10.3)
Chloride: 82 mmol/L — ABNORMAL LOW (ref 98–111)
Chloride: 85 mmol/L — ABNORMAL LOW (ref 98–111)
Creatinine, Ser: 0.48 mg/dL — ABNORMAL LOW (ref 0.61–1.24)
Creatinine, Ser: 0.32 mg/dL — ABNORMAL LOW (ref 0.61–1.24)
GFR, Estimated: >60 mL/min (ref >60)
GFR, Estimated: >60 mL/min (ref >60)
Glucose, Bld: 177 mg/dL — ABNORMAL HIGH (ref 70–99)
Glucose, Bld: 132 mg/dL — ABNORMAL HIGH (ref 70–99)
Potassium: 4 mmol/L (ref 3.5–5.1)
Potassium: 3.7 mmol/L (ref 3.5–5.1)
Sodium: 117 mmol/L — CL (ref 135–145)
Sodium: 120 mmol/L — ABNORMAL LOW (ref 135–145)
Total Bilirubin: 16.7 mg/dL — ABNORMAL HIGH (ref 0.0–1.2)
Total Bilirubin: 16.5 mg/dL — ABNORMAL HIGH (ref 0.0–1.2)
Total Protein: 6.1 g/dL — ABNORMAL LOW (ref 6.5–8.1)
Total Protein: 6 g/dL — ABNORMAL LOW (ref 6.5–8.1)

## 2024-04-03 LAB — MRSA NEXT GEN BY PCR, NASAL: MRSA by PCR Next Gen: DETECTED — AB

## 2024-04-03 LAB — BODY FLUID CELL COUNT WITH DIFFERENTIAL
Eos, Fluid: 0 %
Lymphs, Fluid: 53 %
Monocyte-Macrophage-Serous Fluid: 30 % — ABNORMAL LOW (ref 50–90)
Neutrophil Count, Fluid: 17 % (ref 0–25)
Total Nucleated Cell Count, Fluid: 74 uL (ref 0–1000)

## 2024-04-03 LAB — LACTATE DEHYDROGENASE, PLEURAL OR PERITONEAL FLUID: LD, Fluid: 86 U/L — ABNORMAL HIGH (ref 3–23)

## 2024-04-03 LAB — URINALYSIS, W/ REFLEX TO CULTURE (INFECTION SUSPECTED)
Glucose, UA: 50 mg/dL — AB
Hgb urine dipstick: NEGATIVE
Ketones, ur: 5 mg/dL — AB
Leukocytes,Ua: NEGATIVE
Nitrite: NEGATIVE
Protein, ur: 100 mg/dL — AB
Specific Gravity, Urine: 1.021 (ref 1.005–1.030)
pH: 6 (ref 5.0–8.0)

## 2024-04-03 LAB — AMMONIA: Ammonia: 80 umol/L — ABNORMAL HIGH (ref 9–35)

## 2024-04-03 LAB — HEPATITIS PANEL, ACUTE
HCV Ab: NONREACTIVE
Hep A IgM: NONREACTIVE
Hep B C IgM: NONREACTIVE
Hepatitis B Surface Ag: NONREACTIVE

## 2024-04-03 LAB — GLUCOSE, PLEURAL OR PERITONEAL FLUID: Glucose, Fluid: 157 mg/dL

## 2024-04-03 LAB — PROTEIN, PLEURAL OR PERITONEAL FLUID: Total protein, fluid: <3 g/dL

## 2024-04-03 LAB — LIPASE, BLOOD: Lipase: 64 U/L — ABNORMAL HIGH (ref 11–51)

## 2024-04-03 LAB — HIV ANTIBODY (ROUTINE TESTING W REFLEX): HIV Screen 4th Generation wRfx: NONREACTIVE

## 2024-04-03 LAB — GLUCOSE, CAPILLARY: Glucose-Capillary: 143 mg/dL — ABNORMAL HIGH (ref 70–99)

## 2024-04-03 LAB — LACTATE DEHYDROGENASE: LDH: 355 U/L — ABNORMAL HIGH (ref 98–192)

## 2024-04-03 LAB — POC OCCULT BLOOD, ED: Fecal Occult Bld: POSITIVE — AB

## 2024-04-03 LAB — ALBUMIN, PLEURAL OR PERITONEAL FLUID: Albumin, Fluid: <1.5 g/dL

## 2024-04-03 LAB — PROTIME-INR
INR: 1 (ref 0.8–1.2)
Prothrombin Time: 14.3 s (ref 11.4–15.2)

## 2024-04-03 LAB — TYPE AND SCREEN
ABO/RH(D): A POS
Antibody Screen: NEGATIVE

## 2024-04-03 LAB — ETHANOL: Alcohol, Ethyl (B): 312 mg/dL (ref <15)

## 2024-04-03 MED ORDER — THIAMINE HCL 100 MG/ML IJ SOLN: 100.0000 mg | Freq: Every day | INTRAMUSCULAR | Status: DC

## 2024-04-03 MED ORDER — SODIUM CHLORIDE 0.9 % IV SOLN
Freq: Once | INTRAVENOUS | Status: AC
Start: 2024-04-03 — End: 2024-04-03

## 2024-04-03 MED ORDER — LORAZEPAM 2 MG/ML IJ SOLN
1.0000 mg | INTRAMUSCULAR | Status: DC | PRN
  Administered 2024-04-04: 1 mg via INTRAVENOUS
  Filled 2024-04-03: qty 1

## 2024-04-03 MED ORDER — ONDANSETRON HCL 4 MG/2ML IJ SOLN
4.0000 mg | Freq: Four times a day (QID) | INTRAMUSCULAR | Status: DC | PRN
  Administered 2024-04-07: 4 mg via INTRAVENOUS
  Filled 2024-04-03: qty 2

## 2024-04-03 MED ORDER — LIDOCAINE HCL (PF) 1 % IJ SOLN
10.0000 mL | Freq: Once | INTRAMUSCULAR | Status: AC
Start: 2024-04-03 — End: 2024-04-04
  Administered 2024-04-04: 10 mL via INTRADERMAL
  Filled 2024-04-03: qty 10

## 2024-04-03 MED ORDER — SODIUM CHLORIDE 0.9 % IV SOLN: INTRAVENOUS | Status: DC

## 2024-04-03 MED ORDER — LACTATED RINGERS IV SOLN: INTRAVENOUS | Status: DC

## 2024-04-03 MED ORDER — LIDOCAINE-EPINEPHRINE 1 %-1:100000 IJ SOLN
10.0000 mL | Freq: Once | INTRAMUSCULAR | Status: DC
  Filled 2024-04-03: qty 1

## 2024-04-03 MED ORDER — ALBUMIN HUMAN 25 % IV SOLN
50.0000 g | Freq: Once | INTRAVENOUS | Status: AC
  Administered 2024-04-03: 50 g via INTRAVENOUS
  Filled 2024-04-03: qty 200

## 2024-04-03 MED ORDER — SODIUM CHLORIDE 0.9 % IV SOLN
Freq: Once | INTRAVENOUS | Status: DC
Start: 2024-04-03 — End: 2024-04-03

## 2024-04-03 MED ORDER — INSULIN ASPART 100 UNIT/ML IJ SOLN
0.0000 [IU] | INTRAMUSCULAR | Status: DC
  Administered 2024-04-04: 2 [IU] via SUBCUTANEOUS
  Administered 2024-04-04: 1 [IU] via SUBCUTANEOUS

## 2024-04-03 MED ORDER — ADULT MULTIVITAMIN W/MINERALS CH
1.0000 | ORAL_TABLET | Freq: Every day | ORAL | Status: DC
  Administered 2024-04-03 – 2024-04-06 (×4): 1 via ORAL
  Filled 2024-04-03 (×4): qty 1

## 2024-04-03 MED ORDER — PANTOPRAZOLE SODIUM 40 MG IV SOLR
40.0000 mg | Freq: Two times a day (BID) | INTRAVENOUS | Status: DC
  Administered 2024-04-03 – 2024-04-05 (×4): 40 mg via INTRAVENOUS
  Filled 2024-04-03 (×4): qty 10

## 2024-04-03 MED ORDER — FOLIC ACID 1 MG PO TABS
1.0000 mg | ORAL_TABLET | Freq: Every day | ORAL | Status: DC
  Administered 2024-04-03 – 2024-04-06 (×4): 1 mg via ORAL
  Filled 2024-04-03 (×4): qty 1

## 2024-04-03 MED ORDER — LORAZEPAM 1 MG PO TABS: 1.0000 mg | ORAL_TABLET | ORAL | Status: DC | PRN

## 2024-04-03 MED ORDER — HYDRALAZINE HCL 20 MG/ML IJ SOLN: 5.0000 mg | Freq: Four times a day (QID) | INTRAMUSCULAR | Status: DC | PRN

## 2024-04-03 MED ORDER — THIAMINE MONONITRATE 100 MG PO TABS
100.0000 mg | ORAL_TABLET | Freq: Every day | ORAL | Status: DC
  Administered 2024-04-03 – 2024-04-06 (×4): 100 mg via ORAL
  Filled 2024-04-03 (×4): qty 1

## 2024-04-03 NOTE — ED Provider Notes (Signed)
 [place] EMERGENCY DEPARTMENT AT [facility] Provider Note   CSN: [id number] Arrival date & time: 04/03/24  1353     Patient presents with: Abdominal Pain   Tanner Cooper is a 43 y.o. male.   43 year old male presents for evaluation of abdominal distention.  He is Spanish-speaking only and interpreter service was used.  Patient states he has had some abdominal distention and has started to become uncomfortable.  States occasionally does have pain but he has been having trouble eating and drinking because he feels full all the time.  He does drink alcohol daily usually has about 4 beers a day and used to drink much more.  Admits to fatigue and overall generalized weakness and he is having trouble walking due to the weakness.  Denies any other symptoms or concerns.   Abdominal Pain      Prior to Admission medications   Medication Sig Start Date End Date Taking? Authorizing Provider  esomeprazole  (NEXIUM ) 40 MG capsule Take 1 capsule (40 mg total) by mouth daily. Patient not taking: Reported on 04/03/2024 02/23/24   Patt Alm Macho, MD  ondansetron  (ZOFRAN ) 4 MG tablet Take 1 tablet (4 mg total) by mouth every 6 (six) hours. Patient not taking: Reported on 04/03/2024 02/23/24   Patt Alm Macho, MD    Allergies: Patient has no known allergies.    Review of Systems  Gastrointestinal:  Positive for abdominal pain.    Updated Vital Signs BP (!) 138/91   Pulse 100   Temp 98.2 F (36.8 C) (Oral)   Resp 13   SpO2 98%   Physical Exam  (all labs ordered are listed, but only abnormal results are displayed) Labs Reviewed  CBC WITH DIFFERENTIAL/PLATELET - Abnormal; Notable for the following components:      Result Value   RBC 4.01 (*)    HCT 38.7 (*)    Neutro Abs 8.6 (*)    Lymphs Abs 0.5 (*)    Abs Immature Granulocytes 0.15 (*)    All other components within normal limits  COMPREHENSIVE METABOLIC PANEL WITH GFR - Abnormal; Notable for the following  components:   Sodium 117 (*)    Chloride 82 (*)    Glucose, Bld 177 (*)    BUN <5 (*)    Creatinine, Ser 0.48 (*)    Calcium  7.6 (*)    Total Protein 6.1 (*)    Albumin  2.3 (*)    AST 306 (*)    ALT 97 (*)    Alkaline Phosphatase 623 (*)    Total Bilirubin 16.7 (*)    All other components within normal limits  LIPASE, BLOOD - Abnormal; Notable for the following components:   Lipase 64 (*)    All other components within normal limits  URINALYSIS, W/ REFLEX TO CULTURE (INFECTION SUSPECTED) - Abnormal; Notable for the following components:   Color, Urine AMBER (*)    APPearance HAZY (*)    Glucose, UA 50 (*)    Bilirubin Urine MODERATE (*)    Ketones, ur 5 (*)    Protein, ur 100 (*)    Bacteria, UA RARE (*)    All other components within normal limits  ETHANOL - Abnormal; Notable for the following components:   Alcohol, Ethyl (B) 312 (*)    All other components within normal limits  AMMONIA - Abnormal; Notable for the following components:   Ammonia 80 (*)    All other components within normal limits  LACTATE DEHYDROGENASE - Abnormal;  Notable for the following components:   LDH 355 (*)    All other components within normal limits  I-STAT CHEM 8, ED - Abnormal; Notable for the following components:   Sodium 121 (*)    Chloride 85 (*)    BUN <3 (*)    Glucose, Bld 184 (*)    Calcium , Ion 0.92 (*)    All other components within normal limits  POC OCCULT BLOOD, ED - Abnormal; Notable for the following components:   Fecal Occult Bld POSITIVE (*)    All other components within normal limits  BODY FLUID CULTURE W GRAM STAIN  PROTIME-INR  OCCULT BLOOD X 1 CARD TO LAB, STOOL  COMPREHENSIVE METABOLIC PANEL WITH GFR  COMPREHENSIVE METABOLIC PANEL WITH GFR  CBC  PROTIME-INR  HIV ANTIBODY (ROUTINE TESTING W REFLEX)  COMPREHENSIVE METABOLIC PANEL WITH GFR  HEPATITIS PANEL, ACUTE  LACTATE DEHYDROGENASE, PLEURAL OR PERITONEAL FLUID  GLUCOSE, PLEURAL OR PERITONEAL FLUID  PROTEIN,  PLEURAL OR PERITONEAL FLUID  ALBUMIN , PLEURAL OR PERITONEAL FLUID   RAPID URINE DRUG SCREEN, HOSP PERFORMED  BODY FLUID CELL COUNT WITH DIFFERENTIAL  TYPE AND SCREEN    EKG: None  Radiology: CT ABDOMEN PELVIS WO CONTRAST Result Date: 04/03/2024 CLINICAL DATA:  Acute abdominal pain EXAM: CT ABDOMEN AND PELVIS WITHOUT CONTRAST TECHNIQUE: Multidetector CT imaging of the abdomen and pelvis was performed following the standard protocol without IV contrast. RADIATION DOSE REDUCTION: This exam was performed according to the departmental dose-optimization program which includes automated exposure control, adjustment of the mA and/or kV according to patient size and/or use of iterative reconstruction technique. COMPARISON:  02/23/2024 FINDINGS: Lower chest: Coronary and aortic atherosclerosis. Mildly elevated right hemidiaphragm with atelectasis along the right diaphragm as well as mild atelectasis in the lingula left lower lobe. Hepatobiliary: Striking diffuse hepatic steatosis. Slightly irregular contour of the liver, cannot exclude cirrhosis or steatohepatitis. Thick-walled gallbladder, nonspecific for inflammation versus hypoproteinemia/hypoalbuminemia. Pancreas: Unremarkable Spleen: Unremarkable Adrenals/Urinary Tract: Unremarkable Stomach/Bowel: Unremarkable Vascular/Lymphatic: Unremarkable Reproductive: Unremarkable Other: New substantial ascites.  Mild mesenteric edema. 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 Appearance unchanged. Fatty bilateral spermatic cords. IMPRESSION: 1. New substantial ascites. 2. Striking diffuse hepatic steatosis. Slightly irregular contour of the liver, cannot exclude cirrhosis or steatohepatitis. 3. Thick-walled gallbladder, nonspecific for inflammation versus hypoproteinemia/hypoalbuminemia. 4. 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 5.  Aortic Atherosclerosis (ICD10-I70.0). Electronically Signed   By: Ryan Salvage M.D.   On: 04/03/2024 14:32     Procedures   Medications Ordered in the ED  LORazepam  (ATIVAN ) tablet 1-4 mg (has no administration in time range)    Or  LORazepam  (ATIVAN ) injection 1-4 mg (has no administration in time range)  thiamine  (VITAMIN B1) tablet 100 mg (100 mg Oral Given 04/03/24 1721)    Or  thiamine  (VITAMIN B1) injection 100 mg ( Intravenous See Alternative 04/03/24 1721)  folic acid  (FOLVITE ) tablet 1 mg (1 mg Oral Given 04/03/24 1741)  multivitamin with minerals tablet 1 tablet (1 tablet Oral Given 04/03/24 1721)  hydrALAZINE  (APRESOLINE ) injection 5 mg (has no administration in time range)  insulin  aspart (novoLOG ) injection 0-6 Units (has no administration in time range)  pantoprazole  (PROTONIX ) injection 40 mg (has no administration in time range)  ondansetron  (ZOFRAN ) injection 4 mg (has no administration in time range)  lidocaine -EPINEPHrine  (XYLOCAINE  W/EPI) 1 %-1:100000 (with pres) injection 10 mL (has no  administration in time range)  lidocaine  (PF) (XYLOCAINE ) 1 % injection 10 mL (has no administration in time range)  0.9 %  sodium chloride  infusion (has no administration in time range)  0.9 %  sodium chloride  infusion ( Intravenous New Bag/Given 04/03/24 1632)  albumin  human 25 % solution 50 g (50 g Intravenous New Bag/Given 04/03/24 1733)                                    Medical Decision Making Social determinants of health: Patient is Spanish-speaking only, history of alcohol abuse  Cardiac monitor interpretation sinus rhythm, no ectopy  Patient here for weakness and abdominal distention.  Found to have severe hyponatremia with a sodium of 117.  Having receiving LR prior to results coming back and I will give him normal saline instead.  Bedside paracentesis performed for diagnostic purposes and patient tolerated  procedure well.  Please see separate note.  Patient also found to have new onset cirrhosis with significant ascites as well as possible malignancy in his hip bone.  I spoke with hospitalist and GI.  Patient will be admitted for further workup and management.  He is agreeable with the plan.  GI will consult.  Problems Addressed: Cirrhosis of liver without ascites, unspecified hepatic cirrhosis type (HCC): undiagnosed new problem with uncertain prognosis ETOH abuse: chronic illness or injury with exacerbation, progression, or side effects of treatment General weakness: undiagnosed new problem with uncertain prognosis Hyponatremia: undiagnosed new problem with uncertain prognosis  Amount and/or Complexity of Data Reviewed External Data Reviewed: notes.    Details: No prior records for review Labs: ordered. Decision-making details documented in ED Course.    Details: Ordered and reviewed by me and patient with severe hyponatremia as well as elevation in his LFTs and ammonia level Radiology: ordered and independent interpretation performed. Decision-making details documented in ED Course.    Details: Ordered and reviewed by me  CT abdomen pelvis shows evidence of cirrhosis and ascites as well as a malignancy possibly in his hip bones/pelvis  ECG/medicine tests: ordered and independent interpretation performed. Decision-making details documented in ED Course.    Details: Ordered and interpreted by me in the absence of cardiology and shows sinus rhythm, no STEMI and there is no previous for comparison Discussion of management or test interpretation with external provider(s): Dr. FORBES -GI-I spoke with her on the phone regarding the patient's case and she we will review patient's workup and place consult  Dr. Royal -hospitalist-I spoke with him on the phone.  The patient states that he will with the patient for further workup and management  Risk OTC drugs. Prescription drug management. Drug therapy  requiring intensive monitoring for toxicity. Decision regarding hospitalization. Diagnosis or treatment significantly limited by social determinants of health. Risk Details: CRITICAL CARE Performed by: Duwaine LITTIE Fusi   Total critical care time: 35 minutes  Critical care time was exclusive of separately billable procedures and treating other patients.  Critical care was necessary to treat or prevent imminent or life-threatening deterioration.  Critical care was time spent personally by me on the following activities: development of treatment plan with patient and/or surrogate as well as nursing, discussions with consultants, evaluation of patient's response to treatment, examination of patient, obtaining history from patient or surrogate, ordering and performing treatments and interventions, ordering and review of laboratory studies, ordering and review of radiographic studies, pulse oximetry and re-evaluation of patient's condition.  Critical Care Total time providing critical care: 35 minutes     Final diagnoses:  Hyponatremia  Cirrhosis of liver without ascites, unspecified hepatic cirrhosis type (HCC)  ETOH abuse  General weakness    ED Discharge Orders     None          Gennaro Duwaine CROME, DO 04/03/24 2031

## 2024-04-03 NOTE — ED Notes (Signed)
 Patient transported to Ultrasound

## 2024-04-03 NOTE — ED Provider Triage Note (Signed)
 Emergency Medicine Provider Triage Evaluation Note  Tanner Cooper , a 43 y.o. male  was evaluated in triage.  Pt complains of abdominal pain pain and distention times several days.  Patient is homeless currently.  Does admit to daily alcohol use..  Review of Systems  Positive: Bilious emesis, black stools Negative: No fevers  Physical Exam  There were no vitals taken for this visit. Gen:   Awake, no distress  Resp:  Normal effort  MSK:   Moves extremities without difficulty  Abdomen distended.  No peritoneal signs.  Question ascites  Medical Decision Making  Medically screening exam initiated at 1:56 PM.  Appropriate orders placed.  [name] was informed that the remainder of the evaluation will be completed by another provider, this initial triage assessment does not replace that evaluation, and the importance of remaining in the ED until their evaluation is complete.     Tanner Faden, MD 04/03/24 1357

## 2024-04-03 NOTE — Consult Note (Signed)
 Consult note   Tanner Cooper FMW:[id number] DOB: 03-04-1981 DOA: 04/03/2024 From: Home code Status full  PCP: Patient, [name]   Chief Complaint: ABD swelling  HPI:  43 year old homeless Spanish-speaking male Known DM TY 2 not taking meds for 6 months as cannot afford EtOH use habituation 2-3 beers per day-seen in ED 02/23/2024 N/V/persistent emesis Apparently not taking insulin -DDx at the time pancreatitis/gastritis/DKA-EtOH level was 455 lactic 3.6 Patient ambulatory and sent home with resolution of symptoms---  9/12 return Tanner Cooper, ED Abdominal distention nausea-difficulty eating with distention-full feeling-generalized fatigue difficulty walking--- tells me that for 2 weeks he has had fullness in his abdomen as wll as sensitivity in the stomach feeling very weak having dark tarry stool like coffee ground for the past 3 days and for the past 1 week has been vomiting everything he eats has not been really able to keep down anything He drinks 4X 24 ounces of beer daily-was able to finish 4 beers last night his last drink was this morning had 124 ounce beer Had 2 episodes of falls yesterday He has been drinking for the past 20 years-he currently is unemployed for the past 2 months 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 No pertinent past medical history. History reviewed. No pertinent surgical history.  reports that he has never smoked. He has never used smokeless tobacco. He reports current alcohol use of about 3.0 standard drinks of alcohol per week. He reports that he does not  currently use drugs.  Mobility: Independent  No Known Allergies History reviewed. No pertinent family history. Prior to Admission medications   Medication Sig Start Date End Date Taking? Authorizing Provider  esomeprazole  (NEXIUM ) 40 MG capsule Take 1 capsule (40 mg total) by mouth daily. 02/23/24   Patt Alm Macho, MD  ondansetron  (ZOFRAN ) 4 MG tablet Take 1 tablet (4 mg total) by mouth every 6 (six) hours. 02/23/24   Patt Alm Macho, MD   Physical Exam:  Vitals:   04/03/24 1357 04/03/24 1510  BP: (!) 131/94 (!) 141/98  Pulse: (!) 111 94  Resp: 18 18  Temp: 98.4 F (36.9 C)   SpO2: 97% 97%    Clinically quite icteric EOMI NCAT no asterixis Chest is clear no rales no rhonchi Abdomen is distended slightly tender cannot appreciate HSM Cannot appreciate lower extremity edema Power is 5/5 S1-S2 no murmur no rub no gallop He is tachycardic   I have personally reviewed following labs and imaging studies  Labs:  IStat Sodium 121 BUN/creatinine 3/0.7 Na 117 WBC 10.3 platelet 182 hemoglobin on i-STAT looks like it is 16 UA hazy appearance rare bacteria 100 protein Ethanol level 312  Repeat labs confirm BMet confirms sodium 117, CL 82 ASt/ALT 306/97 baseline about a month ago 256/178-alk phos 623 total bili 16.7 from No INR performed Anion gap is 13 Lipase 64 Ammonia 80 FOBT confirmed as positive  RBC 13.6 previously 15 platelet 182 WBC 10.3 neutrophils 83  Imaging studies:  CT abdomen chest pelvis = substantial ascites hepatic steatosis irregular contour thick-walled gallbladder nonspecific  inflammation versus hypoalbuminemia ?  Osteoid osteochondroma from left ischium?  Ischiofemoral impingement  Medical tests:  EKG independently reviewed: As above  Test discussed with performing physician: Discussed with ED physician, discussed with gastroenterology  Decision to obtain old records:  VS  Review and summation of old records:  Yes  Principal Problem:    Alcoholic hepatitis   Assessment/Plan Acute alcoholic hepatitis DDx Acute liver failure--steatohepatitis in the setting of chronic drinking Awaiting INR to decide if this is hepatitis from alcoholism versus failure--cannot calculate Madrey or MELD at this time Would hold prednisolone 60/40 and neck at this time until we have more clarity Overall he has a poor prognosis Possible upper GI bleed given history although hemoglobin seems to refute this GI to see-Hemoccult ordered twas positive Has had a mild drop in hemoglobin- -type and screen-watch for overt melena-care order placed for nursing to inform Risk of alcoholic withdrawal with DTs Ativan  protocol placed on progressive unit low threshold to involve CCM if worsens He is calm now Ammonia is 80 but he is clear mentally surprisingly-I would wait on giving lactulose at this time --can order if mental status changes occur Ascites  ascitic tap-get Gram stain cell count LDH total protein and albumin  Giving albumin  50 now Hold diuretics at this time given severe hyponatremia May need further albumin  depending on how much fluid they tap Elevated lipase? In the setting of persistent nausea vomiting unclear how to interpret-he has generalized abdominal discomfort and swelling- -CT also does not really comment--I am not convinced he has a pancreatitis Severe hypervolemic hyponatremia in the setting of continued potomania Every 6 labs with sodium--Will change LR to NS 100 cc/h--- albumin  as above Obtain urine sodium, urine osmolality if it does not improve within the next 12 hours-expect it will given his history is more consistent with loss of free water from excessive vomiting Mental state is normal he will need progressive level management  DM TY 2 uncontrolled No insulin  X 6 months Every 4 sugars, only clear liquids, CBG every 4 Will place on diabetic diet after seen by GI if they determined he can eat we will allow the  same    Severity of Illness: The appropriate patient status for this patient is INPATIENT. Inpatient status is judged to be reasonable and necessary in order to provide the required intensity of service to ensure the patient's safety. The patient's presenting symptoms, physical exam findings, and initial radiographic and laboratory data in the context of their chronic comorbidities is felt to place them at high risk for further clinical deterioration. Furthermore, it is not anticipated that the patient will be medically stable for discharge from the hospital within 2 midnights of admission.   * I certify that at the point of admission it is my clinical judgment that the patient will require inpatient hospital care spanning beyond 2 midnights from the point of admission due to high intensity of service, high risk for further deterioration and high frequency of surveillance required.*   Family Communication: No family he is homeless and TOC consult  DVT ppx: SCD Consults called & Whom: GI  Time spent: 90 minutes  Royal, MD [days-call my NP partners at night for Care related issues] [facility] --[facility] [employer] [place] , [email or web address]; password [facility]  04/03/2024, 4:24 PM

## 2024-04-03 NOTE — ED Triage Notes (Signed)
 Pt here for abd distention, n,v, weakness. Hx of HTN and diabetes. Pt drinks ETOH everyday, last drink was 0930. Axox4.

## 2024-04-03 NOTE — ED Provider Notes (Signed)
 [place] EMERGENCY DEPARTMENT AT [facility] Provider Note   CSN: [id number] Arrival date & time: 04/03/24  1353     Patient presents with: Abdominal Pain   Tanner Cooper is a 43 y.o. male.   Duplicate   Abdominal Pain Associated symptoms: no chest pain, no chills, no cough, no dysuria, no fever, no hematuria, no shortness of breath, no sore throat and no vomiting        Prior to Admission medications   Medication Sig Start Date End Date Taking? Authorizing Provider  esomeprazole  (NEXIUM ) 40 MG capsule Take 1 capsule (40 mg total) by mouth daily. Patient not taking: Reported on 04/03/2024 02/23/24   Patt Alm Macho, MD  ondansetron  (ZOFRAN ) 4 MG tablet Take 1 tablet (4 mg total) by mouth every 6 (six) hours. Patient not taking: Reported on 04/03/2024 02/23/24   Patt Alm Macho, MD    Allergies: Patient has no known allergies.    Review of Systems  Constitutional:  Negative for chills and fever.  HENT:  Negative for ear pain and sore throat.   Eyes:  Negative for pain and visual disturbance.  Respiratory:  Negative for cough and shortness of breath.   Cardiovascular:  Negative for chest pain and palpitations.  Gastrointestinal:  Positive for abdominal pain. Negative for vomiting.  Genitourinary:  Negative for dysuria and hematuria.  Musculoskeletal:  Negative for arthralgias and back pain.  Skin:  Negative for color change and rash.  Neurological:  Negative for seizures and syncope.  All other systems reviewed and are negative.   Updated Vital Signs BP (!) 129/92 (BP Location: Right Arm)   Pulse (!) 101   Temp 99 F (37.2 C) (Oral)   Resp 18   Ht 5' 4 (1.626 m)   Wt 77 kg   SpO2 93%   BMI 29.14 kg/m   Physical Exam Vitals and nursing note reviewed.  Constitutional:      General: He is not in acute distress.    Appearance: He is well-developed. He is ill-appearing.  HENT:     Head: Normocephalic and atraumatic.  Eyes:      Conjunctiva/sclera: Conjunctivae normal.     Comments: Scleral icterus  Cardiovascular:     Rate and Rhythm: Normal rate and regular rhythm.     Heart sounds: No murmur heard. Pulmonary:     Effort: Pulmonary effort is normal. No respiratory distress.     Breath sounds: Normal breath sounds.  Abdominal:     General: There is distension.     Palpations: Abdomen is soft. There is fluid wave.     Tenderness: There is no abdominal tenderness.  Musculoskeletal:        General: No swelling.     Cervical back: Neck supple.  Skin:    General: Skin is warm and dry.     Capillary Refill: Capillary refill takes less than 2 seconds.  Neurological:     Mental Status: He is alert.  Psychiatric:        Mood and Affect: Mood normal.     (all labs ordered are listed, but only abnormal results are displayed) Labs Reviewed  CBC WITH DIFFERENTIAL/PLATELET - Abnormal; Notable for the following components:      Result Value   RBC 4.01 (*)    HCT 38.7 (*)    Neutro Abs 8.6 (*)    Lymphs Abs 0.5 (*)    Abs Immature Granulocytes 0.15 (*)    All other components within normal  limits  COMPREHENSIVE METABOLIC PANEL WITH GFR - Abnormal; Notable for the following components:   Sodium 117 (*)    Chloride 82 (*)    Glucose, Bld 177 (*)    BUN <5 (*)    Creatinine, Ser 0.48 (*)    Calcium  7.6 (*)    Total Protein 6.1 (*)    Albumin  2.3 (*)    AST 306 (*)    ALT 97 (*)    Alkaline Phosphatase 623 (*)    Total Bilirubin 16.7 (*)    All other components within normal limits  LIPASE, BLOOD - Abnormal; Notable for the following components:   Lipase 64 (*)    All other components within normal limits  URINALYSIS, W/ REFLEX TO CULTURE (INFECTION SUSPECTED) - Abnormal; Notable for the following components:   Color, Urine AMBER (*)    APPearance HAZY (*)    Glucose, UA 50 (*)    Bilirubin Urine MODERATE (*)    Ketones, ur 5 (*)    Protein, ur 100 (*)    Bacteria, UA RARE (*)    All other components  within normal limits  ETHANOL - Abnormal; Notable for the following components:   Alcohol, Ethyl (B) 312 (*)    All other components within normal limits  AMMONIA - Abnormal; Notable for the following components:   Ammonia 80 (*)    All other components within normal limits  LACTATE DEHYDROGENASE - Abnormal; Notable for the following components:   LDH 355 (*)    All other components within normal limits  GLUCOSE, CAPILLARY - Abnormal; Notable for the following components:   Glucose-Capillary 143 (*)    All other components within normal limits  I-STAT CHEM 8, ED - Abnormal; Notable for the following components:   Sodium 121 (*)    Chloride 85 (*)    BUN <3 (*)    Glucose, Bld 184 (*)    Calcium , Ion 0.92 (*)    All other components within normal limits  POC OCCULT BLOOD, ED - Abnormal; Notable for the following components:   Fecal Occult Bld POSITIVE (*)    All other components within normal limits  BODY FLUID CULTURE W GRAM STAIN  MRSA NEXT GEN BY PCR, NASAL  PROTIME-INR  OCCULT BLOOD X 1 CARD TO LAB, STOOL  COMPREHENSIVE METABOLIC PANEL WITH GFR  COMPREHENSIVE METABOLIC PANEL WITH GFR  CBC  PROTIME-INR  HIV ANTIBODY (ROUTINE TESTING W REFLEX)  COMPREHENSIVE METABOLIC PANEL WITH GFR  HEPATITIS PANEL, ACUTE  LACTATE DEHYDROGENASE, PLEURAL OR PERITONEAL FLUID  GLUCOSE, PLEURAL OR PERITONEAL FLUID  PROTEIN, PLEURAL OR PERITONEAL FLUID  ALBUMIN , PLEURAL OR PERITONEAL FLUID   RAPID URINE DRUG SCREEN, HOSP PERFORMED  BODY FLUID CELL COUNT WITH DIFFERENTIAL  TYPE AND SCREEN    EKG: None  Radiology: US  Abdomen Complete Result Date: 04/03/2024 CLINICAL DATA:  Cholecystitis. EXAM: ABDOMEN ULTRASOUND COMPLETE COMPARISON:  CT abdomen and pelvis 04/03/2024. FINDINGS: Gallbladder: No gallstones identified. There is diffuse gallbladder wall thickening measuring 9 mm. No sonographic Murphy sign noted by sonographer. Common bile duct: Diameter: 5.1 mm Liver: Echogenic and coarse  parenchymal echotexture. Nodular contour. Portal vein is patent on color Doppler imaging with normal direction of blood flow towards the liver. IVC: No abnormality visualized. Pancreas: Visualized portion unremarkable. Spleen: Size and appearance within normal limits. Right Kidney: Length: 11.4 cm. Echogenicity within normal limits. No mass or hydronephrosis visualized. Left Kidney: Length: 12.6 cm. Echogenicity within normal limits. No mass or hydronephrosis visualized. Abdominal aorta: No  aneurysm visualized. Limited visualization secondary to bowel-gas. Other findings: There is large volume ascites, right greater than left. IMPRESSION: 1. Cirrhotic morphology of the liver. 2. Large volume ascites. 3. Diffuse gallbladder wall thickening without evidence of cholelithiasis or sonographic Murphy sign. Findings are nonspecific and may be related to underlying liver disease. Electronically Signed   By: Greig Pique M.D.   On: 04/03/2024 20:50   CT ABDOMEN PELVIS WO CONTRAST Result Date: 04/03/2024 CLINICAL DATA:  Acute abdominal pain EXAM: CT ABDOMEN AND PELVIS WITHOUT CONTRAST TECHNIQUE: Multidetector CT imaging of the abdomen and pelvis was performed following the standard protocol without IV contrast. RADIATION DOSE REDUCTION: This exam was performed according to the departmental dose-optimization program which includes automated exposure control, adjustment of the mA and/or kV according to patient size and/or use of iterative reconstruction technique. COMPARISON:  02/23/2024 FINDINGS: Lower chest: Coronary and aortic atherosclerosis. Mildly elevated right hemidiaphragm with atelectasis along the right diaphragm as well as mild atelectasis in the lingula left lower lobe. Hepatobiliary: Striking diffuse hepatic steatosis. Slightly irregular contour of the liver, cannot exclude cirrhosis or steatohepatitis. Thick-walled gallbladder, nonspecific for inflammation versus hypoproteinemia/hypoalbuminemia. Pancreas:  Unremarkable Spleen: Unremarkable Adrenals/Urinary Tract: Unremarkable Stomach/Bowel: Unremarkable Vascular/Lymphatic: Unremarkable Reproductive: Unremarkable Other: New substantial ascites.  Mild mesenteric edema. 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 Appearance unchanged. Fatty bilateral spermatic cords. IMPRESSION: 1. New substantial ascites. 2. Striking diffuse hepatic steatosis. Slightly irregular contour of the liver, cannot exclude cirrhosis or steatohepatitis. 3. Thick-walled gallbladder, nonspecific for inflammation versus hypoproteinemia/hypoalbuminemia. 4. 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 5.  Aortic Atherosclerosis (ICD10-I70.0). Electronically Signed   By: Ryan Salvage M.D.   On: 04/03/2024 14:32     Procedures   Medications Ordered in the ED  LORazepam  (ATIVAN ) tablet 1-4 mg (has no administration in time range)    Or  LORazepam  (ATIVAN ) injection 1-4 mg (has no administration in time range)  thiamine  (VITAMIN B1) tablet 100 mg (100 mg Oral Given 04/03/24 1721)    Or  thiamine  (VITAMIN B1) injection 100 mg ( Intravenous See Alternative 04/03/24 1721)  folic acid  (FOLVITE ) tablet 1 mg (1 mg Oral Given 04/03/24 1741)  multivitamin with minerals tablet 1 tablet (1 tablet Oral Given 04/03/24 1721)  hydrALAZINE  (APRESOLINE ) injection 5 mg (has no administration in time range)  insulin  aspart (novoLOG ) injection 0-6 Units (has no administration in time range)  pantoprazole  (PROTONIX ) injection 40 mg (40 mg Intravenous Given 04/03/24 2126)  ondansetron  (ZOFRAN ) injection 4 mg (has no administration in time range)  lidocaine -EPINEPHrine  (XYLOCAINE  W/EPI) 1 %-1:100000 (with pres) injection 10 mL (has no administration in time range)  lidocaine  (PF) (XYLOCAINE ) 1 % injection 10 mL (has no  administration in time range)  0.9 %  sodium chloride  infusion (has no administration in time range)  0.9 %  sodium chloride  infusion ( Intravenous New Bag/Given 04/03/24 1632)  albumin  human 25 % solution 50 g (50 g Intravenous New Bag/Given 04/03/24 1733)                                    Medical Decision Making Amount and/or Complexity of Data Reviewed Labs: ordered.  Risk Prescription drug management. Decision regarding hospitalization.    Final diagnoses:  Hyponatremia  Cirrhosis of liver without ascites, unspecified hepatic cirrhosis type (HCC)  ETOH abuse  General weakness    ED Discharge Orders     None          Gennaro Duwaine CROME, DO 04/03/24 2127

## 2024-04-03 NOTE — Plan of Care (Signed)
 Body fluid WBC count 74 which <250 rules out SBP.  No need for antibiotic at this time. Also Maddrey's discrimination score is 27.3 which is less than 32 in this case no indication for steroid.

## 2024-04-03 NOTE — Hospital Course (Addendum)
 [name]

## 2024-04-03 NOTE — ED Provider Notes (Signed)
  [place] EMERGENCY DEPARTMENT AT [facility] Provider Note  ABDOMINAL PARACENTESIS  Date/Time: 04/03/2024 7:40 PM  Performed by: Dionisio Blunt, MD Authorized by: Gennaro Duwaine CROME, DO  Consent: Verbal consent obtained Risks and benefits: risks, benefits and alternatives were discussed Consent given by: patient Patient understanding: patient states understanding of the procedure being performed Patient consent: the patient's understanding of the procedure matches consent given Procedure consent: procedure consent matches procedure scheduled Relevant documents: relevant documents present and verified Test results: test results available and properly labeled Site marked: the operative site was marked Imaging studies: imaging studies available Required items: required blood products, implants, devices, and special equipment available Patient identity confirmed: hospital-assigned identification number and provided demographic data Time out: Immediately prior to procedure a time out was called to verify the correct patient, procedure, equipment, support staff and site/side marked as required. Preparation: Patient was prepped and draped in the usual sterile fashion. Local anesthesia used: yes Anesthesia: local infiltration  Anesthesia: Local anesthesia used: yes Local Anesthetic: lidocaine  1% without epinephrine  Anesthetic total: 3 mL  Sedation: Patient sedated: no  Patient tolerance: patient tolerated the procedure well with no immediate complications Comments: 20cc of straw colored fluid drawn out. Patient tolerated the procedure well. No immediate complications         Dionisio Blunt, MD 04/03/24 1944    Gennaro Duwaine CROME, DO 04/03/24 2032

## 2024-04-03 NOTE — Consult Note (Addendum)
 Consultation  Referring Provider: TRH/Samtani Primary Care Physician:  Patient, [name] Primary Gastroenterologist: Sampson  Reason for Consultation: Acute alcoholic hepatitis  HPI: Tanner Cooper is a 43 y.o. non-English-speaking Hispanic male male with history of GERD, and Chronic EtOH use.  He presented to the emergency room today with primary complaint of weakness.  He states he has also been having some upper abdominal discomfort over the past couple of weeks.  He says his abdomen feels full and heavy and that he has not been eating well because he eats a little bit and feels full and uncomfortable.  He has been having some vomiting primarily at nighttime has not vomited up any blood, states the emesis has been yellow.  No current heartburn or indigestion.  He has noticed some blackish appearing stools over the past 4 days. He usually drinks 4, 24 ounce beers per day.  He says sometimes he will drink less, denies drinking liquor.  He says he does not use any other drugs/substances. Last EtOH was this a.m., 1 beer.  He relates that he felt weak enough to fall yesterday.  Has had daily EtOH use over the past 20 years, recently unemployed.  He has not had any previous GI diagnoses.  Labs in ER-WBC 10.3/hemoglobin 13.6/hematocrit 38.7/platelets 182 Lipase 64 EtOH level 312 Sodium 117/potassium 4 BUN less than 5/creatinine 0.48 T. bili 16.7/alk phos 623/AST 306/ALT 97 INR still pending Ammonia 80 Acute hepatitis panel pending/HIV pending Stool documented heme positive  CT abdomen and pelvis without contrast today shows atelectasis right lower lobe, strikingly diffuse hepatic steatosis slightly irregular contour of the liver cannot exclude cirrhosis, thick walled gallbladder nonspecific for inflammation, unremarkable pancreas, large amount of ascites mild mesenteric edema.  Hemodynamically stable, afebrile Ultrasound paracentesis pending  Reviewing labs from the ER visit  in August-LFTs with T. bili 0.9/alk phos 115/AST 256/ALT 178  History reviewed. No pertinent past medical history.  History reviewed. No pertinent surgical history.  Prior to Admission medications   Medication Sig Start Date End Date Taking? Authorizing Provider  esomeprazole  (NEXIUM ) 40 MG capsule Take 1 capsule (40 mg total) by mouth daily. 02/23/24   Patt Alm Macho, MD  ondansetron  (ZOFRAN ) 4 MG tablet Take 1 tablet (4 mg total) by mouth every 6 (six) hours. 02/23/24   Patt Alm Macho, MD    Current Facility-Administered Medications  Medication Dose Route Frequency Provider Last Rate Last Admin   folic acid  (FOLVITE ) tablet 1 mg  1 mg Oral Daily Samtani, Jai-Gurmukh, MD   1 mg at 04/03/24 1741   hydrALAZINE  (APRESOLINE ) injection 5 mg  5 mg Intravenous Q6H PRN Samtani, Jai-Gurmukh, MD       insulin  aspart (novoLOG ) injection 0-6 Units  0-6 Units Subcutaneous Q4H Samtani, Jai-Gurmukh, MD       LORazepam  (ATIVAN ) tablet 1-4 mg  1-4 mg Oral Q1H PRN Samtani, Jai-Gurmukh, MD       Or   LORazepam  (ATIVAN ) injection 1-4 mg  1-4 mg Intravenous Q1H PRN Samtani, Jai-Gurmukh, MD       multivitamin with minerals tablet 1 tablet  1 tablet Oral Daily Samtani, Jai-Gurmukh, MD   1 tablet at 04/03/24 1721   pantoprazole  (PROTONIX ) injection 40 mg  40 mg Intravenous Q12H Esterwood, Amy S, PA-C       thiamine  (VITAMIN B1) tablet 100 mg  100 mg Oral Daily Samtani, Jai-Gurmukh, MD   100 mg at 04/03/24 1721   Or   thiamine  (VITAMIN B1) injection 100 mg  100 mg Intravenous Daily Samtani, Jai-Gurmukh, MD       Current Outpatient Medications  Medication Sig Dispense Refill   esomeprazole  (NEXIUM ) 40 MG capsule Take 1 capsule (40 mg total) by mouth daily. 30 capsule 0   ondansetron  (ZOFRAN ) 4 MG tablet Take 1 tablet (4 mg total) by mouth every 6 (six) hours. 15 tablet 0    Allergies as of 04/03/2024   (No Known Allergies)    History reviewed. No pertinent family history.  Social History    Socioeconomic History   Marital status: Single    Spouse name: Not on file   Number of children: Not on file   Years of education: Not on file   Highest education level: Not on file  Occupational History   Not on file  Tobacco Use   Smoking status: Never   Smokeless tobacco: Never  Substance and Sexual Activity   Alcohol use: Yes    Alcohol/week: 3.0 standard drinks of alcohol    Types: 3 Cans of beer per week   Drug use: Not Currently   Sexual activity: Not on file  Other Topics Concern   Not on file  Social History Narrative   Not on file   Social Drivers of Health   Financial Resource Strain: Not on file  Food Insecurity: Not on file  Transportation Needs: Not on file  Physical Activity: Not on file  Stress: Not on file  Social Connections: Not on file  Intimate Partner Violence: Not on file    Review of Systems: Pertinent positive and negative review of systems were noted in the above HPI section.  All other review of systems was otherwise negative.   Physical Exam: Vital signs in last 24 hours: Temp:  [98.4 F (36.9 C)] 98.4 F (36.9 C) (09/12 1357) Pulse Rate:  [86-111] 94 (09/12 1715) Resp:  [15-20] 15 (09/12 1715) BP: (116-145)/(81-110) 116/81 (09/12 1715) SpO2:  [94 %-97 %] 96 % (09/12 1715)   General:   Alert,  Well-developed, well-nourished, Hispanic male pleasant and cooperative in NAD, jaundiced Head:  Normocephalic and atraumatic. Eyes:  Sclera icteric   conjunctiva pink. Ears:  Normal auditory acuity. Nose:  No deformity, discharge,  or lesions. Mouth:  No deformity or lesions.   Neck:  Supple; no masses or thyromegaly. Lungs:  Clear throughout to auscultation.   No wheezes, crackles, or rhonchi.  Heart: Mild tachycardia regular rate and rhythm; no murmurs, clicks, rubs,  or gallops. Abdomen:  Soft, no definite hepatosplenomegaly, there are some tenderness in the epigastrium, no rebound, no palpable mass, full feeling no definite fluid  wave Rectal: Not done, stool documented heme positive Msk:  Symmetrical without gross deformities. . Pulses:  Normal pulses noted. Extremities: Trace edema Neurologic:  Alert and  oriented x4;  grossly normal neurologically.  No current tremor and no asterixis Skin:  Intact without significant lesions or rashes.. Psych:  Alert and cooperative. Normal mood and affect.  Intake/Output from previous day: No intake/output data recorded. Intake/Output this shift: No intake/output data recorded.  Lab Results: Recent Labs    04/03/24 1400 04/03/24 1457  WBC 10.3  --   HGB 13.6 16.0  HCT 38.7* 47.0  PLT 182  --    BMET Recent Labs    04/03/24 1400 04/03/24 1457  NA 117* 121*  K 4.0 4.2  CL 82* 85*  CO2 22  --   GLUCOSE 177* 184*  BUN <5* <3*  CREATININE 0.48* 0.70  CALCIUM  7.6*  --  LFT Recent Labs    04/03/24 1400  PROT 6.1*  ALBUMIN  2.3*  AST 306*  ALT 97*  ALKPHOS 623*  BILITOT 16.7*   PT/INR No results for input(s): LABPROT, INR in the last 72 hours. Hepatitis Panel No results for input(s): HEPBSAG, HCVAB, HEPAIGM, HEPBIGM in the last 72 hours.  IMPRESSION:  #66 43 year old non-English-speaking Hispanic male with long history of chronic daily EtOH use, currently drinking  4 - 24 ounce beers per day who comes to the ER feeling poorly over the past couple of weeks with decrease in oral intake, frequent vomiting usually at nighttime and upper abdominal discomfort.  He also complains of feeling generally full and swollen in his abdomen, no hematemesis, has had complaint of dark stool over the past 4 days.  Labs show an acute hepatitis  CT scan with severely steatotic liver versus cirrhotic liver with new significant ascites and thick-walled gallbladder  Most likely this is acute alcoholic hepatitis-need INR in order to calculate MELD and discriminant function score to determine if he qualifies for prednisolone/NAC. Rule out other acute hepatitides,  await acute hepatitis panel, consider autoimmune workup.   He does not appear to have acute fulminant hepatic failure.    He also needs to have an ultrasound complete to further evaluate the gallbladder and paracentesis to rule out SBP prior to initiating steroids.  #2 report of melena/black stool-no anemia-rule out EtOH induced gastropathy, ulcer disease no varices noted on noncontrasted CT  #3 severe hyponatremia-management per medicine  PLAN: Complete abdominal ultrasound to further evaluate the gallbladder, then paracentesis, diagnostic tap to rule out SBP  Full liquid diet IV PPI twice daily Zofran  4 mg every 6 hours as needed for nausea vomiting CIWA protocol Await acute hepatitis panel, and INR so can calculate MELD and discriminant function score Hold on initiating prednisolone until we rule out SBP and calculate discriminant function Gradual correction of sodium Drug screen May need eventual EGD this admission,  serial hemoglobins Daily c-Met and INR GI will follow with you   Amy EsterwoodPA-C  04/03/2024, 5:44 PM   Attending physician's note  I personally saw the patient and performed a substantive portion of the medical decision making process for this encounter (including a complete performance of the key components : MDM, Hx and Exam), in conjunction with the APP.  I agree with the APP's note, impression, and  the management plan for the number and complexity of problems addressed at the encounter for the patient and take responsibility for that plan with its inherent risk of complications, morbidity, or mortality with additional input as follows.     43 year old Hispanic male, non-English-speaking, language interpreter was used. Chronic alcohol use, he was drinking 5 to 6, 24 ounce beers per day and has cut back in the past month to 3-4 , 24 ounce cans of beer came in with abdominal pain, feeling fullness and weakness.  On exam abdomen is distended with generalized  tenderness worse in the right side Jaundice No asterixis  All labs are not back to calculate MELD and discriminant function score  Obtain diagnostic paracentesis to exclude SBP  Plan to start prednisolone 40 mg daily if discriminant function score is greater than 32 and has no signs of infection  Severe electrolyte derangement with severe hyponatremia, improving with volume expansion  Elevated bilirubin secondary to acute alcoholic hepatitis, continue to monitor daily LFT  No known history of cirrhosis  History of ?  Melena, monitor CBC and for signs of bleeding,  will plan for EGD based on his clinical status  Monitor for alcohol withdrawal  Continue full liquid diet and advance as tolerated   High complex medical decision making (this includes chart review, review of results, face-to-face time used for counseling as well as treatment plan and follow-up. The patient was provided an opportunity to ask questions and all were answered. The patient agreed with the plan and demonstrated an understanding of the instructions.  LOIS Wilkie Mcgee , MD [phone]

## 2024-04-04 ENCOUNTER — Inpatient Hospital Stay (HOSPITAL_COMMUNITY): Payer: MEDICAID

## 2024-04-04 DIAGNOSIS — E871 Hypo-osmolality and hyponatremia: Principal | ICD-10-CM

## 2024-04-04 DIAGNOSIS — F1010 Alcohol abuse, uncomplicated: Secondary | ICD-10-CM

## 2024-04-04 DIAGNOSIS — K7581 Nonalcoholic steatohepatitis (NASH): Secondary | ICD-10-CM

## 2024-04-04 LAB — COMPREHENSIVE METABOLIC PANEL WITH GFR
ALT: 70 U/L — ABNORMAL HIGH (ref 0–44)
ALT: 80 U/L — ABNORMAL HIGH (ref 0–44)
ALT: 83 U/L — ABNORMAL HIGH (ref 0–44)
ALT: 85 U/L — ABNORMAL HIGH (ref 0–44)
ALT: 87 U/L — ABNORMAL HIGH (ref 0–44)
AST: 225 U/L — ABNORMAL HIGH (ref 15–41)
AST: 251 U/L — ABNORMAL HIGH (ref 15–41)
AST: 262 U/L — ABNORMAL HIGH (ref 15–41)
AST: 266 U/L — ABNORMAL HIGH (ref 15–41)
AST: 282 U/L — ABNORMAL HIGH (ref 15–41)
Albumin: 2.2 g/dL — ABNORMAL LOW (ref 3.5–5.0)
Albumin: 2.3 g/dL — ABNORMAL LOW (ref 3.5–5.0)
Albumin: 2.3 g/dL — ABNORMAL LOW (ref 3.5–5.0)
Albumin: 2.5 g/dL — ABNORMAL LOW (ref 3.5–5.0)
Albumin: 2.5 g/dL — ABNORMAL LOW (ref 3.5–5.0)
Alkaline Phosphatase: 472 U/L — ABNORMAL HIGH (ref 38–126)
Alkaline Phosphatase: 541 U/L — ABNORMAL HIGH (ref 38–126)
Alkaline Phosphatase: 571 U/L — ABNORMAL HIGH (ref 38–126)
Alkaline Phosphatase: 583 U/L — ABNORMAL HIGH (ref 38–126)
Alkaline Phosphatase: 608 U/L — ABNORMAL HIGH (ref 38–126)
Anion gap: 12 (ref 5–15)
Anion gap: 13 (ref 5–15)
Anion gap: 13 (ref 5–15)
Anion gap: 15 (ref 5–15)
Anion gap: 15 (ref 5–15)
BUN: <5 mg/dL — ABNORMAL LOW (ref 6–20)
BUN: <5 mg/dL — ABNORMAL LOW (ref 6–20)
BUN: <5 mg/dL — ABNORMAL LOW (ref 6–20)
BUN: <5 mg/dL — ABNORMAL LOW (ref 6–20)
BUN: <5 mg/dL — ABNORMAL LOW (ref 6–20)
CO2: 20 mmol/L — ABNORMAL LOW (ref 22–32)
CO2: 20 mmol/L — ABNORMAL LOW (ref 22–32)
CO2: 21 mmol/L — ABNORMAL LOW (ref 22–32)
CO2: 24 mmol/L (ref 22–32)
CO2: 25 mmol/L (ref 22–32)
Calcium: 7.3 mg/dL — ABNORMAL LOW (ref 8.9–10.3)
Calcium: 7.5 mg/dL — ABNORMAL LOW (ref 8.9–10.3)
Calcium: 7.5 mg/dL — ABNORMAL LOW (ref 8.9–10.3)
Calcium: 7.6 mg/dL — ABNORMAL LOW (ref 8.9–10.3)
Calcium: 7.8 mg/dL — ABNORMAL LOW (ref 8.9–10.3)
Chloride: 83 mmol/L — ABNORMAL LOW (ref 98–111)
Chloride: 84 mmol/L — ABNORMAL LOW (ref 98–111)
Chloride: 85 mmol/L — ABNORMAL LOW (ref 98–111)
Chloride: 85 mmol/L — ABNORMAL LOW (ref 98–111)
Chloride: 85 mmol/L — ABNORMAL LOW (ref 98–111)
Creatinine, Ser: 0.37 mg/dL — ABNORMAL LOW (ref 0.61–1.24)
Creatinine, Ser: 0.38 mg/dL — ABNORMAL LOW (ref 0.61–1.24)
Creatinine, Ser: 0.39 mg/dL — ABNORMAL LOW (ref 0.61–1.24)
Creatinine, Ser: 0.44 mg/dL — ABNORMAL LOW (ref 0.61–1.24)
Creatinine, Ser: 0.45 mg/dL — ABNORMAL LOW (ref 0.61–1.24)
GFR, Estimated: >60 mL/min (ref >60)
GFR, Estimated: >60 mL/min (ref >60)
GFR, Estimated: >60 mL/min (ref >60)
GFR, Estimated: >60 mL/min (ref >60)
GFR, Estimated: >60 mL/min (ref >60)
Glucose, Bld: 107 mg/dL — ABNORMAL HIGH (ref 70–99)
Glucose, Bld: 114 mg/dL — ABNORMAL HIGH (ref 70–99)
Glucose, Bld: 131 mg/dL — ABNORMAL HIGH (ref 70–99)
Glucose, Bld: 181 mg/dL — ABNORMAL HIGH (ref 70–99)
Glucose, Bld: 197 mg/dL — ABNORMAL HIGH (ref 70–99)
Potassium: 2.7 mmol/L — CL (ref 3.5–5.1)
Potassium: 3.3 mmol/L — ABNORMAL LOW (ref 3.5–5.1)
Potassium: 3.4 mmol/L — ABNORMAL LOW (ref 3.5–5.1)
Potassium: 3.7 mmol/L (ref 3.5–5.1)
Potassium: 3.7 mmol/L (ref 3.5–5.1)
Sodium: 118 mmol/L — CL (ref 135–145)
Sodium: 119 mmol/L — CL (ref 135–145)
Sodium: 120 mmol/L — ABNORMAL LOW (ref 135–145)
Sodium: 120 mmol/L — ABNORMAL LOW (ref 135–145)
Sodium: 123 mmol/L — ABNORMAL LOW (ref 135–145)
Total Bilirubin: 16.3 mg/dL — ABNORMAL HIGH (ref 0.0–1.2)
Total Bilirubin: 18.6 mg/dL (ref 0.0–1.2)
Total Bilirubin: 18.6 mg/dL (ref 0.0–1.2)
Total Bilirubin: 19.4 mg/dL (ref 0.0–1.2)
Total Bilirubin: 20 mg/dL (ref 0.0–1.2)
Total Protein: 5.5 g/dL — ABNORMAL LOW (ref 6.5–8.1)
Total Protein: 5.6 g/dL — ABNORMAL LOW (ref 6.5–8.1)
Total Protein: 5.7 g/dL — ABNORMAL LOW (ref 6.5–8.1)
Total Protein: 5.8 g/dL — ABNORMAL LOW (ref 6.5–8.1)
Total Protein: 5.8 g/dL — ABNORMAL LOW (ref 6.5–8.1)

## 2024-04-04 LAB — CBC
HCT: 36 % — ABNORMAL LOW (ref 39.0–52.0)
Hemoglobin: 13.1 g/dL (ref 13.0–17.0)
MCH: 34.8 pg — ABNORMAL HIGH (ref 26.0–34.0)
MCHC: 36.4 g/dL — ABNORMAL HIGH (ref 30.0–36.0)
MCV: 95.7 fL (ref 80.0–100.0)
Platelets: 184 K/uL (ref 150–400)
RBC: 3.76 MIL/uL — ABNORMAL LOW (ref 4.22–5.81)
RDW: 14.2 % (ref 11.5–15.5)
WBC: 10.7 K/uL — ABNORMAL HIGH (ref 4.0–10.5)
nRBC: 0 % (ref 0.0–0.2)

## 2024-04-04 LAB — GLUCOSE, CAPILLARY
Glucose-Capillary: 121 mg/dL — ABNORMAL HIGH (ref 70–99)
Glucose-Capillary: 123 mg/dL — ABNORMAL HIGH (ref 70–99)
Glucose-Capillary: 191 mg/dL — ABNORMAL HIGH (ref 70–99)
Glucose-Capillary: 211 mg/dL — ABNORMAL HIGH (ref 70–99)
Glucose-Capillary: 242 mg/dL — ABNORMAL HIGH (ref 70–99)
Glucose-Capillary: 96 mg/dL (ref 70–99)

## 2024-04-04 LAB — HEMOGLOBIN A1C
Hgb A1c MFr Bld: 7.3 % — ABNORMAL HIGH (ref 4.8–5.6)
Mean Plasma Glucose: 162.81 mg/dL

## 2024-04-04 LAB — PROTIME-INR
INR: 1 (ref 0.8–1.2)
Prothrombin Time: 14.3 s (ref 11.4–15.2)

## 2024-04-04 LAB — OSMOLALITY, URINE: Osmolality, Ur: 504 mosm/kg (ref 300–900)

## 2024-04-04 LAB — SODIUM, URINE, RANDOM: Sodium, Ur: <30 mmol/L

## 2024-04-04 LAB — MAGNESIUM: Magnesium: 1.6 mg/dL — ABNORMAL LOW (ref 1.7–2.4)

## 2024-04-04 MED ORDER — CALCIUM GLUCONATE-NACL 1-0.675 GM/50ML-% IV SOLN
1.0000 g | Freq: Once | INTRAVENOUS | Status: AC
  Administered 2024-04-04: 1000 mg via INTRAVENOUS
  Filled 2024-04-04: qty 50

## 2024-04-04 MED ORDER — INSULIN ASPART 100 UNIT/ML IJ SOLN
0.0000 [IU] | Freq: Three times a day (TID) | INTRAMUSCULAR | Status: DC
  Administered 2024-04-04: 5 [IU] via SUBCUTANEOUS
  Administered 2024-04-05 – 2024-04-06 (×2): 3 [IU] via SUBCUTANEOUS
  Administered 2024-04-06: 8 [IU] via SUBCUTANEOUS
  Administered 2024-04-06 – 2024-04-07 (×2): 2 [IU] via SUBCUTANEOUS
  Administered 2024-04-07: 5 [IU] via SUBCUTANEOUS
  Administered 2024-04-07: 8 [IU] via SUBCUTANEOUS
  Administered 2024-04-08 (×2): 3 [IU] via SUBCUTANEOUS
  Administered 2024-04-08: 8 [IU] via SUBCUTANEOUS
  Administered 2024-04-09: 5 [IU] via SUBCUTANEOUS
  Administered 2024-04-09: 11 [IU] via SUBCUTANEOUS
  Administered 2024-04-09 – 2024-04-10 (×2): 3 [IU] via SUBCUTANEOUS
  Administered 2024-04-10: 5 [IU] via SUBCUTANEOUS
  Administered 2024-04-10: 3 [IU] via SUBCUTANEOUS
  Administered 2024-04-11: 5 [IU] via SUBCUTANEOUS

## 2024-04-04 MED ORDER — INSULIN GLARGINE-YFGN 100 UNIT/ML ~~LOC~~ SOLN: 4.0000 [IU] | Freq: Every day | SUBCUTANEOUS | Status: DC

## 2024-04-04 MED ORDER — INSULIN ASPART 100 UNIT/ML IJ SOLN
2.0000 [IU] | Freq: Three times a day (TID) | INTRAMUSCULAR | Status: DC
Start: 2024-04-04 — End: 2024-04-06
  Administered 2024-04-04 – 2024-04-05 (×4): 2 [IU] via SUBCUTANEOUS

## 2024-04-04 MED ORDER — INSULIN GLARGINE 100 UNIT/ML ~~LOC~~ SOLN
4.0000 [IU] | Freq: Every day | SUBCUTANEOUS | Status: DC
  Administered 2024-04-04 – 2024-04-08 (×5): 4 [IU] via SUBCUTANEOUS
  Filled 2024-04-04 (×7): qty 0.04

## 2024-04-04 MED ORDER — ALBUMIN HUMAN 25 % IV SOLN
25.0000 g | Freq: Four times a day (QID) | INTRAVENOUS | Status: AC
  Administered 2024-04-04 – 2024-04-05 (×3): 25 g via INTRAVENOUS
  Filled 2024-04-04 (×3): qty 100

## 2024-04-04 MED ORDER — FUROSEMIDE 10 MG/ML IJ SOLN
40.0000 mg | Freq: Once | INTRAMUSCULAR | Status: AC
  Administered 2024-04-04: 40 mg via INTRAVENOUS
  Filled 2024-04-04: qty 4

## 2024-04-04 MED ORDER — LIDOCAINE HCL (PF) 1 % IJ SOLN: 10.0000 mL | Freq: Once | INTRAMUSCULAR | Status: DC

## 2024-04-04 MED ORDER — POTASSIUM CHLORIDE CRYS ER 20 MEQ PO TBCR
40.0000 meq | EXTENDED_RELEASE_TABLET | Freq: Every day | ORAL | Status: DC
  Administered 2024-04-04: 40 meq via ORAL
  Filled 2024-04-04: qty 2

## 2024-04-04 MED ORDER — PROPRANOLOL HCL 20 MG PO TABS
10.0000 mg | ORAL_TABLET | Freq: Two times a day (BID) | ORAL | Status: DC
  Administered 2024-04-04 – 2024-04-11 (×13): 10 mg via ORAL
  Filled 2024-04-04: qty 0.5
  Filled 2024-04-04: qty 1
  Filled 2024-04-04 (×2): qty 0.5
  Filled 2024-04-04 (×2): qty 1
  Filled 2024-04-04: qty 0.5
  Filled 2024-04-04 (×2): qty 1
  Filled 2024-04-04 (×3): qty 0.5
  Filled 2024-04-04: qty 1
  Filled 2024-04-04: qty 0.5
  Filled 2024-04-04 (×3): qty 1
  Filled 2024-04-04 (×2): qty 0.5

## 2024-04-04 MED ORDER — POTASSIUM CHLORIDE 10 MEQ/100ML IV SOLN
10.0000 meq | INTRAVENOUS | Status: AC
  Administered 2024-04-04 – 2024-04-05 (×4): 10 meq via INTRAVENOUS
  Filled 2024-04-04 (×4): qty 100

## 2024-04-04 NOTE — Progress Notes (Signed)
 [facility] ROUNDING NOTE   Subjective: Overall feels better, denies any abdominal pain, melena or rectal bleeding He underwent diagnostic paracentesis, 3 L ascites fluid was removed   Objective: Vital signs in last 24 hours: Temp:  [98.2 F (36.8 C)-100.8 F (38.2 C)] 100.8 F (38.2 C) (09/13 1519) Pulse Rate:  [86-136] 90 (09/13 1519) Resp:  [13-22] 20 (09/13 1519) BP: (116-145)/(81-110) 141/96 (09/13 1519) SpO2:  [90 %-98 %] 90 % (09/13 1519) Weight:  [77 kg] 77 kg (09/12 2115) Last BM Date : 04/04/24 General: NAD Lungs: Bilateral clear Heart: S1-S2 Abdomen: Soft, mild distention, no tenderness Ext: No edema    Intake/Output from previous day: 09/12 0701 - 09/13 0700 In: 726.6 [I.V.:726.6] Out: -  Intake/Output this shift: Total I/O In: 596 [P.O.:596] Out: -    Lab Results: Recent Labs    04/03/24 1400 04/03/24 1457 04/04/24 0551  WBC 10.3  --  10.7*  HGB 13.6 16.0 13.1  PLT 182  --  184  MCV 96.5  --  95.7   BMET Recent Labs    04/03/24 2338 04/04/24 0551 04/04/24 1157  NA 120* 120* 118*  K 3.7 3.7 3.4*  CL 84* 85* 85*  CO2 21* 20* 20*  GLUCOSE 114* 107* 181*  BUN <5* <5* <5*  CREATININE 0.37* 0.45* 0.44*  CALCIUM  7.5* 7.8* 7.5*   LFT Recent Labs    04/03/24 2338 04/04/24 0551 04/04/24 1157  PROT 5.5* 5.8* 5.7*  ALBUMIN  2.3* 2.3* 2.2*  AST 266* 282* 262*  ALT 83* 87* 85*  ALKPHOS 541* 583* 608*  BILITOT 16.3* 18.6* 18.6*   PT/INR Recent Labs    04/03/24 1741 04/04/24 0551  INR 1.0 1.0      Imaging/Other results: US  Paracentesis Result Date: 04/04/2024 INDICATION: 43 year old male who presented to the ED with abdominal distension. Abdominal ultrasound revealed cirrhotic liver and large volume ascites. Request for therapeutic paracentesis. EXAM: ULTRASOUND GUIDED for MEDICATIONS: 1% lidocaine , 9 mL. COMPLICATIONS: None immediate. PROCEDURE: Informed written consent was obtained from the patient after a discussion  of the risks, benefits and alternatives to treatment. A timeout was performed prior to the initiation of the procedure. Initial ultrasound scanning demonstrates a large amount of ascites within the right lower abdominal quadrant. The right lower abdomen was prepped and draped in the usual sterile fashion. 1% lidocaine  was used for local anesthesia. Following this, a 19 gauge, 7-cm, Yueh catheter was introduced. An ultrasound image was saved for documentation purposes. The paracentesis was performed. The catheter was removed and a dressing was applied. The patient tolerated the procedure well without immediate post procedural complication. Patient received post-procedure intravenous albumin ; see nursing notes for details. FINDINGS: A total of approximately 3.1 L of dark yellow fluid was removed. IMPRESSION: Successful ultrasound-guided paracentesis yielding 3.1 liters of peritoneal fluid. Procedure performed by: Sherrilee Bal, PA-C under the supervision of Dr. JONETTA Faes. Electronically Signed   By: JONETTA Faes M.D.   On: 04/04/2024 14:22   US  Abdomen Complete Result Date: 04/03/2024 CLINICAL DATA:  Cholecystitis. EXAM: ABDOMEN ULTRASOUND COMPLETE COMPARISON:  CT abdomen and pelvis 04/03/2024. FINDINGS: Gallbladder: No gallstones identified. There is diffuse gallbladder wall thickening measuring 9 mm. No sonographic Murphy sign noted by sonographer. Common bile duct: Diameter: 5.1 mm Liver: Echogenic and coarse parenchymal echotexture. Nodular contour. Portal vein is patent on color Doppler imaging with normal direction of blood flow towards the liver. IVC: No abnormality visualized. Pancreas: Visualized portion unremarkable. Spleen: Size and appearance within normal limits. Right  Kidney: Length: 11.4 cm. Echogenicity within normal limits. No mass or hydronephrosis visualized. Left Kidney: Length: 12.6 cm. Echogenicity within normal limits. No mass or hydronephrosis visualized. Abdominal aorta: No aneurysm  visualized. Limited visualization secondary to bowel-gas. Other findings: There is large volume ascites, right greater than left. IMPRESSION: 1. Cirrhotic morphology of the liver. 2. Large volume ascites. 3. Diffuse gallbladder wall thickening without evidence of cholelithiasis or sonographic Murphy sign. Findings are nonspecific and may be related to underlying liver disease. Electronically Signed   By: Greig Pique M.D.   On: 04/03/2024 20:50   CT ABDOMEN PELVIS WO CONTRAST Result Date: 04/03/2024 CLINICAL DATA:  Acute abdominal pain EXAM: CT ABDOMEN AND PELVIS WITHOUT CONTRAST TECHNIQUE: Multidetector CT imaging of the abdomen and pelvis was performed following the standard protocol without IV contrast. RADIATION DOSE REDUCTION: This exam was performed according to the departmental dose-optimization program which includes automated exposure control, adjustment of the mA and/or kV according to patient size and/or use of iterative reconstruction technique. COMPARISON:  02/23/2024 FINDINGS: Lower chest: Coronary and aortic atherosclerosis. Mildly elevated right hemidiaphragm with atelectasis along the right diaphragm as well as mild atelectasis in the lingula left lower lobe. Hepatobiliary: Striking diffuse hepatic steatosis. Slightly irregular contour of the liver, cannot exclude cirrhosis or steatohepatitis. Thick-walled gallbladder, nonspecific for inflammation versus hypoproteinemia/hypoalbuminemia. Pancreas: Unremarkable Spleen: Unremarkable Adrenals/Urinary Tract: Unremarkable Stomach/Bowel: Unremarkable Vascular/Lymphatic: Unremarkable Reproductive: Unremarkable Other: New substantial ascites.  Mild mesenteric edema. 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 Appearance unchanged. Fatty bilateral spermatic cords. IMPRESSION: 1. New substantial ascites. 2. Striking diffuse hepatic steatosis.  Slightly irregular contour of the liver, cannot exclude cirrhosis or steatohepatitis. 3. Thick-walled gallbladder, nonspecific for inflammation versus hypoproteinemia/hypoalbuminemia. 4. 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 5.  Aortic Atherosclerosis (ICD10-I70.0). Electronically Signed   By: Ryan Salvage M.D.   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 The patient was provided an opportunity to ask questions and all were answered. The patient agreed with the plan and demonstrated an understanding of the instructions.      LOIS Wilkie Mcgee , MD [phone]  [facility]

## 2024-04-04 NOTE — Plan of Care (Signed)

## 2024-04-04 NOTE — Procedures (Signed)
 PROCEDURE SUMMARY:  Successful US  guided paracentesis from right lateral abdomen.  Yielded 3.1 liters of dark yellow fluid.  No immediate complications.  Patient tolerated well.  EBL = trace   Malissie Musgrave CHRISTELLA Bal PA-C 04/04/2024 10:37 AM

## 2024-04-04 NOTE — H&P (Signed)
 Consult note   [facility] FMW:[id number] DOB: 1981-04-12 DOA: 04/03/2024 From: Home code Status full  PCP: Patient, [name]   Chief Complaint: ABD swelling  HPI:  43 year old homeless Spanish-speaking male Known DM TY 2 not taking meds for 6 months as cannot afford EtOH use habituation 2-3 beers per day-seen in ED 02/23/2024 N/V/persistent emesis Apparently not taking insulin -DDx at the time pancreatitis/gastritis/DKA-EtOH level was 455 lactic 3.6 Patient ambulatory and sent home with resolution of symptoms---  9/12 return [name], ED Abdominal distention nausea-difficulty eating with distention-full feeling-generalized fatigue difficulty walking--- tells me that for 2 weeks he has had fullness in his abdomen as wll as sensitivity in the stomach feeling very weak having dark tarry stool like coffee ground for the past 3 days and for the past 1 week has been vomiting everything he eats has not been really able to keep down anything He drinks 4X 24 ounces of beer daily-was able to finish 4 beers last night his last drink was this morning had 124 ounce beer Had 2 episodes of falls yesterday He has been drinking for the past 20 years-he currently is unemployed for the past 2 months 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 No pertinent past medical history. History reviewed. No pertinent surgical history.  reports that he has never smoked. He has never used smokeless tobacco. He reports current alcohol use of about 3.0 standard drinks of alcohol per week. He reports that he does not  currently use drugs.  Mobility: Independent  No Known Allergies History reviewed. No pertinent family history. Prior to Admission medications   Medication Sig Start Date End Date Taking? Authorizing Provider  esomeprazole  (NEXIUM ) 40 MG capsule Take 1 capsule (40 mg total) by mouth daily. 02/23/24   Patt Alm Macho, MD  ondansetron  (ZOFRAN ) 4 MG tablet Take 1 tablet (4 mg total) by mouth every 6 (six) hours. 02/23/24   Patt Alm Macho, MD   Physical Exam:  Vitals:   04/04/24 0806 04/04/24 1114  BP: (!) 142/108 (!) 141/105  Pulse: (!) 122 (!) 112  Resp: (!) 22 18  Temp: 99.3 F (37.4 C) (!) 100.6 F (38.1 C)  SpO2: 92% 94%    Clinically quite icteric EOMI NCAT no asterixis Chest is clear no rales no rhonchi Abdomen is distended slightly tender cannot appreciate HSM Cannot appreciate lower extremity edema Power is 5/5 S1-S2 no murmur no rub no gallop He is tachycardic   I have personally reviewed following labs and imaging studies  Labs:  IStat Sodium 121 BUN/creatinine 3/0.7 Na 117 WBC 10.3 platelet 182 hemoglobin on i-STAT looks like it is 16 UA hazy appearance rare bacteria 100 protein Ethanol level 312  Repeat labs confirm BMet confirms sodium 117, CL 82 ASt/ALT 306/97 baseline about a month ago 256/178-alk phos 623 total bili 16.7 from No INR performed Anion gap is 13 Lipase 64 Ammonia 80 FOBT confirmed as positive  RBC 13.6 previously 15 platelet 182 WBC 10.3 neutrophils 83  Imaging studies:  CT abdomen chest pelvis = substantial ascites hepatic  steatosis irregular contour thick-walled gallbladder nonspecific inflammation versus hypoalbuminemia ?  Osteoid osteochondroma from left ischium?  Ischiofemoral impingement  Medical tests:  EKG independently reviewed: As above  Test discussed with performing physician: Discussed with ED physician, discussed with gastroenterology  Decision to obtain old records:  VS  Review and summation of old records:   Yes  Principal Problem:   Alcoholic hepatitis Active Problems:   Acute liver failure   Assessment/Plan Acute alcoholic hepatitis DDx Acute liver failure--steatohepatitis in the setting of chronic drinking Awaiting INR to decide if this is hepatitis from alcoholism versus failure--cannot calculate Madrey or MELD at this time Would hold prednisolone 60/40 and neck at this time until we have more clarity Overall he has a poor prognosis Possible upper GI bleed given history although hemoglobin seems to refute this GI to see-Hemoccult ordered twas positive Has had a mild drop in hemoglobin- -type and screen-watch for overt melena-care order placed for nursing to inform Risk of alcoholic withdrawal with DTs Ativan  protocol placed on progressive unit low threshold to involve CCM if worsens He is calm now Ammonia is 80 but he is clear mentally surprisingly-I would wait on giving lactulose at this time --can order if mental status changes occur Ascites  ascitic tap-get Gram stain cell count LDH total protein and albumin  Giving albumin  50 now Hold diuretics at this time given severe hyponatremia May need further albumin  depending on how much fluid they tap Elevated lipase? In the setting of persistent nausea vomiting unclear how to interpret-he has generalized abdominal discomfort and swelling- -CT also does not really comment--I am not convinced he has a pancreatitis Severe hypervolemic hyponatremia in the setting of continued potomania Every 6 labs with sodium--Will change LR to NS 100 cc/h--- albumin  as above Obtain urine sodium, urine osmolality if it does not improve within the next 12 hours-expect it will given his history is more consistent with loss of free water from excessive vomiting Mental state is normal he will need progressive level management  DM TY 2 uncontrolled No insulin  X 6 months Every 4 sugars, only clear liquids, CBG every 4 Will place on diabetic diet after seen  by GI if they determined he can eat we will allow the same    Severity of Illness: The appropriate patient status for this patient is INPATIENT. Inpatient status is judged to be reasonable and necessary in order to provide the required intensity of service to ensure the patient's safety. The patient's presenting symptoms, physical exam findings, and initial radiographic and laboratory data in the context of their chronic comorbidities is felt to place them at high risk for further clinical deterioration. Furthermore, it is not anticipated that the patient will be medically stable for discharge from the hospital within 2 midnights of admission.   * I certify that at the point of admission it is my clinical judgment that the patient will require inpatient hospital care spanning beyond 2 midnights from the point of admission due to high intensity of service, high risk for further deterioration and high frequency of surveillance required.*   Family Communication: No family he is homeless and TOC consult  DVT ppx: SCD Consults called & Whom: GI  Time spent: 90 minutes  Royal, MD [days-call my NP partners at night for Care related issues] [facility] --[facility]ia [employer] [place] , [email or web address]; password [facility]  04/04/2024, 1:24 PM

## 2024-04-04 NOTE — Progress Notes (Signed)
 [facility]   ROUNDING   NOTE Tanner [facility] FMW:[id number]  DOB: [birth year]  DOA: 04/03/2024  PCP: Patient, [name]  04/04/2024,9:29 AM  LOS: 1 day    Code Status:  full     From:  unclear    43 year old homeless Spanish-speaking male Known DM TY 2 not taking meds for 6 months as cannot afford EtOH use habituation 2-3 beers per day-seen in ED 02/23/2024 N/V/persistent emesis Apparently not taking insulin -DDx at the time pancreatitis/gastritis/DKA-EtOH level was 455 lactic 3.6 Patient ambulatory and sent home with resolution of symptoms---   9/12 return [facility], ED Abdominal distention nausea-difficulty eating with distention-full feeling-generalized fatigue difficulty walking--- tells me that for 2 weeks he has had fullness in his abdomen as wll as sensitivity in the stomach feeling very weak having dark tarry stool like coffee ground for the past 3 days  Bmet confirms sodium 117, CL 82 Ast/ALT 306/97 baseline about a month ago 256/178-alk phos 623 total bili 16.7 from No INR performed Anion gap is 13 Lipase 64 Ammonia 80 FOBT confirmed as positive  Imaging confirms cirrhosis without cholecystitis    Assessment  & Plan :    Alcoholic hepatitis discriminant score 6, MELD score is low Does not require prednisolone Supportive management although bilirubin keeps climbing INR is less than 1 Hopeful for recovery-defer to GI Ascites without SBP based on ascitic tap Severe hypervolemic hyponatremia DDx tea toast potomania with volume overload Ascitic tap shows less than 250 PMN-no need for antibiotics Large-volume paracentesis planned for today Scenario discussed with Dr. Dolan nephrology who recommends albumin  25 every 6 Lasix  40 and we will recheck labs every 8 Dynamically stable and mentating fine does not require 3% saline for management Check labs every 8 for sodium and keep on progressive unit watch for mental status changes ?  GI bleed upper Hemoccult positive on  admission-hemoglobin however staying in the 13 range Defer to GI further planning-had loose dark stool today according to nursing staff Risk alcohol withdrawal Patient on Ativan  protocol CIWA score is about 5 DM TY 2 noncompliant secondary to inability to get meds Noncompliant X 6 months Sliding scale for now and allow diet CBGs 191-242 Adding small dose long-acting 4 units Chronic homelessness   Data Reviewed:   Sodium 118 potassium 3.4 CO2 20 BUN/creatinine 5/0.4 Alk phos 608 AST 262 ALT 85 Total bilirubin 18.6 Urine awesome 504 urine sodium 30 INR 1.0 WBC 10.7 hemoglobin 13.1 platelet 184  DVT prophylaxis: SCD  Status is: Inpatient Remains inpatient appropriate because:   Requires improvement in labs etc.     Current Dispo: Inpatient     Subjective:   Awake coherent conversant ate breakfast 1 dark stool reported by him to me overnight No chest pain Abdomen still feels swollen and slight tender but improved from prior No nausea no vomiting currently    Objective + exam Vitals:   04/04/24 0027 04/04/24 0401 04/04/24 0658 04/04/24 0806  BP: (!) 121/95 (!) 138/97  (!) 142/108  Pulse: 100 (!) 104 (!) 136 (!) 122  Resp: 17 16  (!) 22  Temp: 98.8 F (37.1 C)   99.3 F (37.4 C)  TempSrc: Oral Oral  Oral  SpO2: 92% 94%  92%  Weight:      Height:       Filed Weights   04/03/24 2115  Weight: 77 kg     Examination: Clearly icteric-neck soft supple-sparse hair distribution and face S1-S2 no murmur Abdomen swollen tense ascites no  HSM some shifting dullness Trace lower extremity edema No asterixis no tremor coherent cognizant  Scheduled Meds:  folic acid   1 mg Oral Daily   insulin  aspart  0-15 Units Subcutaneous TID WC   insulin  aspart  2 Units Subcutaneous TID WC   insulin  glargine  4 Units Subcutaneous QHS   lidocaine  (PF)  10 mL Intradermal Once   lidocaine -EPINEPHrine   10 mL Intradermal Once   multivitamin with minerals  1 tablet Oral Daily    pantoprazole  (PROTONIX ) IV  40 mg Intravenous Q12H   propranolol   10 mg Oral BID   thiamine   100 mg Oral Daily   Or   thiamine   100 mg Intravenous Daily   Continuous Infusions:  albumin  human 25 g (04/04/24 1229)    Time 33  Tanner Ralyn Stlaurent, MD  [facility]

## 2024-04-05 LAB — CBC WITH DIFFERENTIAL/PLATELET
Abs Immature Granulocytes: 0.18 K/uL — ABNORMAL HIGH (ref 0.00–0.07)
Basophils Absolute: 0.1 K/uL (ref 0.0–0.1)
Basophils Relative: 1 %
Eosinophils Absolute: 0.2 K/uL (ref 0.0–0.5)
Eosinophils Relative: 2 %
HCT: 33.1 % — ABNORMAL LOW (ref 39.0–52.0)
Hemoglobin: 12 g/dL — ABNORMAL LOW (ref 13.0–17.0)
Immature Granulocytes: 2 %
Lymphocytes Relative: 9 %
Lymphs Abs: 1.1 K/uL (ref 0.7–4.0)
MCH: 34.9 pg — ABNORMAL HIGH (ref 26.0–34.0)
MCHC: 36.3 g/dL — ABNORMAL HIGH (ref 30.0–36.0)
MCV: 96.2 fL (ref 80.0–100.0)
Monocytes Absolute: 1.5 K/uL — ABNORMAL HIGH (ref 0.1–1.0)
Monocytes Relative: 12 %
Neutro Abs: 9.1 K/uL — ABNORMAL HIGH (ref 1.7–7.7)
Neutrophils Relative %: 74 %
Platelets: 173 K/uL (ref 150–400)
RBC: 3.44 MIL/uL — ABNORMAL LOW (ref 4.22–5.81)
RDW: 13.8 % (ref 11.5–15.5)
WBC: 12.2 K/uL — ABNORMAL HIGH (ref 4.0–10.5)
nRBC: 0.2 % (ref 0.0–0.2)

## 2024-04-05 LAB — PROTIME-INR
INR: 1.2 (ref 0.8–1.2)
Prothrombin Time: 15.6 s — ABNORMAL HIGH (ref 11.4–15.2)

## 2024-04-05 LAB — COMPREHENSIVE METABOLIC PANEL WITH GFR
ALT: 69 U/L — ABNORMAL HIGH (ref 0–44)
ALT: 74 U/L — ABNORMAL HIGH (ref 0–44)
AST: 213 U/L — ABNORMAL HIGH (ref 15–41)
AST: 216 U/L — ABNORMAL HIGH (ref 15–41)
Albumin: 2.9 g/dL — ABNORMAL LOW (ref 3.5–5.0)
Albumin: 2.8 g/dL — ABNORMAL LOW (ref 3.5–5.0)
Alkaline Phosphatase: 507 U/L — ABNORMAL HIGH (ref 38–126)
Alkaline Phosphatase: 569 U/L — ABNORMAL HIGH (ref 38–126)
Anion gap: 11 (ref 5–15)
Anion gap: 13 (ref 5–15)
BUN: <5 mg/dL — ABNORMAL LOW (ref 6–20)
BUN: <5 mg/dL — ABNORMAL LOW (ref 6–20)
CO2: 25 mmol/L (ref 22–32)
CO2: 23 mmol/L (ref 22–32)
Calcium: 7.8 mg/dL — ABNORMAL LOW (ref 8.9–10.3)
Calcium: 7.7 mg/dL — ABNORMAL LOW (ref 8.9–10.3)
Chloride: 88 mmol/L — ABNORMAL LOW (ref 98–111)
Chloride: 83 mmol/L — ABNORMAL LOW (ref 98–111)
Creatinine, Ser: 0.38 mg/dL — ABNORMAL LOW (ref 0.61–1.24)
Creatinine, Ser: 0.43 mg/dL — ABNORMAL LOW (ref 0.61–1.24)
GFR, Estimated: >60 mL/min (ref >60)
GFR, Estimated: >60 mL/min (ref >60)
Glucose, Bld: 94 mg/dL (ref 70–99)
Glucose, Bld: 179 mg/dL — ABNORMAL HIGH (ref 70–99)
Potassium: 3.3 mmol/L — ABNORMAL LOW (ref 3.5–5.1)
Potassium: 3 mmol/L — ABNORMAL LOW (ref 3.5–5.1)
Sodium: 124 mmol/L — ABNORMAL LOW (ref 135–145)
Sodium: 119 mmol/L — CL (ref 135–145)
Total Bilirubin: 20.2 mg/dL (ref 0.0–1.2)
Total Bilirubin: 24.1 mg/dL (ref 0.0–1.2)
Total Protein: 6.1 g/dL — ABNORMAL LOW (ref 6.5–8.1)
Total Protein: 6.3 g/dL — ABNORMAL LOW (ref 6.5–8.1)

## 2024-04-05 LAB — GLUCOSE, CAPILLARY
Glucose-Capillary: 90 mg/dL (ref 70–99)
Glucose-Capillary: 185 mg/dL — ABNORMAL HIGH (ref 70–99)
Glucose-Capillary: 96 mg/dL (ref 70–99)
Glucose-Capillary: 242 mg/dL — ABNORMAL HIGH (ref 70–99)

## 2024-04-05 LAB — MAGNESIUM: Magnesium: 1.6 mg/dL — ABNORMAL LOW (ref 1.7–2.4)

## 2024-04-05 MED ORDER — POTASSIUM CHLORIDE CRYS ER 20 MEQ PO TBCR
40.0000 meq | EXTENDED_RELEASE_TABLET | Freq: Every day | ORAL | Status: DC
  Administered 2024-04-05 – 2024-04-07 (×3): 40 meq via ORAL
  Filled 2024-04-05 (×3): qty 2

## 2024-04-05 MED ORDER — PANTOPRAZOLE SODIUM 40 MG PO TBEC
40.0000 mg | DELAYED_RELEASE_TABLET | Freq: Two times a day (BID) | ORAL | Status: DC
  Administered 2024-04-05 – 2024-04-14 (×18): 40 mg via ORAL
  Filled 2024-04-05 (×18): qty 1

## 2024-04-05 MED ORDER — FUROSEMIDE 10 MG/ML IJ SOLN
40.0000 mg | Freq: Two times a day (BID) | INTRAMUSCULAR | Status: DC
  Administered 2024-04-05 – 2024-04-07 (×5): 40 mg via INTRAVENOUS
  Filled 2024-04-05 (×5): qty 4

## 2024-04-05 MED ORDER — POTASSIUM CHLORIDE CRYS ER 20 MEQ PO TBCR
40.0000 meq | EXTENDED_RELEASE_TABLET | Freq: Every day | ORAL | Status: DC
  Administered 2024-04-05: 40 meq via ORAL
  Filled 2024-04-05: qty 2

## 2024-04-05 MED ORDER — ALBUMIN HUMAN 25 % IV SOLN
25.0000 g | Freq: Four times a day (QID) | INTRAVENOUS | Status: AC
  Administered 2024-04-05 (×2): 25 g via INTRAVENOUS
  Filled 2024-04-05 (×2): qty 100

## 2024-04-05 MED ORDER — CALCIUM GLUCONATE-NACL 2-0.675 GM/100ML-% IV SOLN
2.0000 g | Freq: Once | INTRAVENOUS | Status: AC
  Administered 2024-04-05: 2000 mg via INTRAVENOUS
  Filled 2024-04-05: qty 100

## 2024-04-05 MED ORDER — POTASSIUM CHLORIDE 10 MEQ/100ML IV SOLN
10.0000 meq | INTRAVENOUS | Status: AC
  Administered 2024-04-05 (×3): 10 meq via INTRAVENOUS
  Filled 2024-04-05 (×3): qty 100

## 2024-04-05 NOTE — H&P (View-Only) (Signed)
  GASTROENTEROLOGY ROUNDING NOTE   Subjective: Overall feels good, denies any abdominal pain no melena or rectal bleeding   Objective: Vital signs in last 24 hours: Temp:  [99 F (37.2 C)-100.8 F (38.2 C)] 99.4 F (37.4 C) (09/14 1132) Pulse Rate:  [69-90] 69 (09/14 1132) Resp:  [16-20] 19 (09/14 1132) BP: (115-146)/(90-98) 115/90 (09/14 1132) SpO2:  [90 %-97 %] 97 % (09/14 1132) Last BM Date : 04/04/24 General: NAD Lungs: Bilateral clear Heart: s1s2  Abdomen: Soft mild distention, nontender, normal bowel sounds Ext: Mild edema    Intake/Output from previous day: 09/13 0701 - 09/14 0700 In: 866 [P.O.:716; IV Piggyback:150] Out: 2250 [Urine:2250] Intake/Output this shift: Total I/O In: -  Out: 700 [Urine:700]   Lab Results: Recent Labs    04/03/24 1400 04/03/24 1457 04/04/24 0551 04/05/24 0206  WBC 10.3  --  10.7* 12.2*  HGB 13.6 16.0 13.1 12.0*  PLT 182  --  184 173  MCV 96.5  --  95.7 96.2   BMET Recent Labs    04/04/24 1628 04/04/24 2307 04/05/24 0206  NA 119* 123* 124*  K 2.7* 3.3* 3.3*  CL 83* 85* 88*  CO2 24 25 25   GLUCOSE 197* 131* 94  BUN <5* <5* <5*  CREATININE 0.39* 0.38* 0.38*  CALCIUM  7.3* 7.6* 7.8*   LFT Recent Labs    04/04/24 1628 04/04/24 2307 04/05/24 0206  PROT 5.8* 5.6* 6.1*  ALBUMIN  2.5* 2.5* 2.9*  AST 251* 225* 213*  ALT 80* 70* 69*  ALKPHOS 571* 472* 507*  BILITOT 19.4* 20.0* 20.2*   PT/INR Recent Labs    04/04/24 0551 04/05/24 0206  INR 1.0 1.2      Imaging/Other results: US  Paracentesis Result Date: 04/04/2024 INDICATION: 43 year old male who presented to the ED with abdominal distension. Abdominal ultrasound revealed cirrhotic liver and large volume ascites. Request for therapeutic paracentesis. EXAM: ULTRASOUND GUIDED for MEDICATIONS: 1% lidocaine , 9 mL. COMPLICATIONS: None immediate. PROCEDURE: Informed written consent was obtained from the patient after a discussion of the risks, benefits and  alternatives to treatment. A timeout was performed prior to the initiation of the procedure. Initial ultrasound scanning demonstrates a large amount of ascites within the right lower abdominal quadrant. The right lower abdomen was prepped and draped in the usual sterile fashion. 1% lidocaine  was used for local anesthesia. Following this, a 19 gauge, 7-cm, Yueh catheter was introduced. An ultrasound image was saved for documentation purposes. The paracentesis was performed. The catheter was removed and a dressing was applied. The patient tolerated the procedure well without immediate post procedural complication. Patient received post-procedure intravenous albumin ; see nursing notes for details. FINDINGS: A total of approximately 3.1 L of dark yellow fluid was removed. IMPRESSION: Successful ultrasound-guided paracentesis yielding 3.1 liters of peritoneal fluid. Procedure performed by: Sherrilee Bal, PA-C under the supervision of Dr. JONETTA Faes. Electronically Signed   By: JONETTA Faes M.D.   On: 04/04/2024 14:22   US  Abdomen Complete Result Date: 04/03/2024 CLINICAL DATA:  Cholecystitis. EXAM: ABDOMEN ULTRASOUND COMPLETE COMPARISON:  CT abdomen and pelvis 04/03/2024. FINDINGS: Gallbladder: No gallstones identified. There is diffuse gallbladder wall thickening measuring 9 mm. No sonographic Murphy sign noted by sonographer. Common bile duct: Diameter: 5.1 mm Liver: Echogenic and coarse parenchymal echotexture. Nodular contour. Portal vein is patent on color Doppler imaging with normal direction of blood flow towards the liver. IVC: No abnormality visualized. Pancreas: Visualized portion unremarkable. Spleen: Size and appearance within normal limits. Right Kidney: Length: 11.4 cm. Echogenicity within  normal limits. No mass or hydronephrosis visualized. Left Kidney: Length: 12.6 cm. Echogenicity within normal limits. No mass or hydronephrosis visualized. Abdominal aorta: No aneurysm visualized. Limited visualization  secondary to bowel-gas. Other findings: There is large volume ascites, right greater than left. IMPRESSION: 1. Cirrhotic morphology of the liver. 2. Large volume ascites. 3. Diffuse gallbladder wall thickening without evidence of cholelithiasis or sonographic Murphy sign. Findings are nonspecific and may be related to underlying liver disease. Electronically Signed   By: Greig Pique M.D.   On: 04/03/2024 20:50   CT ABDOMEN PELVIS WO CONTRAST Result Date: 04/03/2024 CLINICAL DATA:  Acute abdominal pain EXAM: CT ABDOMEN AND PELVIS WITHOUT CONTRAST TECHNIQUE: Multidetector CT imaging of the abdomen and pelvis was performed following the standard protocol without IV contrast. RADIATION DOSE REDUCTION: This exam was performed according to the departmental dose-optimization program which includes automated exposure control, adjustment of the mA and/or kV according to patient size and/or use of iterative reconstruction technique. COMPARISON:  02/23/2024 FINDINGS: Lower chest: Coronary and aortic atherosclerosis. Mildly elevated right hemidiaphragm with atelectasis along the right diaphragm as well as mild atelectasis in the lingula left lower lobe. Hepatobiliary: Striking diffuse hepatic steatosis. Slightly irregular contour of the liver, cannot exclude cirrhosis or steatohepatitis. Thick-walled gallbladder, nonspecific for inflammation versus hypoproteinemia/hypoalbuminemia. Pancreas: Unremarkable Spleen: Unremarkable Adrenals/Urinary Tract: Unremarkable Stomach/Bowel: Unremarkable Vascular/Lymphatic: Unremarkable Reproductive: Unremarkable Other: New substantial ascites.  Mild mesenteric edema. 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 Appearance unchanged. Fatty bilateral spermatic cords. IMPRESSION: 1. New substantial ascites. 2. Striking diffuse hepatic steatosis. Slightly irregular contour of the  liver, cannot exclude cirrhosis or steatohepatitis. 3. Thick-walled gallbladder, nonspecific for inflammation versus hypoproteinemia/hypoalbuminemia. 4. 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 5.  Aortic Atherosclerosis (ICD10-I70.0). Electronically Signed   By: Ryan Salvage M.D.   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  He never had EGD Will schedule EGD tomorrow with Dr. Stacia to exclude gastroduodenal ulcer or any signs of portal hypertension N.p.o. after midnight  Severe hyponatremia improving  Discussed alcohol cessation   This visit required >35 minutes of patient care (this includes precharting, chart review, review of results, face-to-face time used for counseling as well as treatment plan and follow-up. The patient was provided an opportunity to ask questions and all were answered. The patient agreed with the plan and demonstrated an understanding of the instructions.      LOIS Wilkie Mcgee , MD [phone]  [facility]

## 2024-04-05 NOTE — Progress Notes (Signed)
  GASTROENTEROLOGY ROUNDING NOTE   Subjective: Overall feels good, denies any abdominal pain no melena or rectal bleeding   Objective: Vital signs in last 24 hours: Temp:  [99 F (37.2 C)-100.8 F (38.2 C)] 99.4 F (37.4 C) (09/14 1132) Pulse Rate:  [69-90] 69 (09/14 1132) Resp:  [16-20] 19 (09/14 1132) BP: (115-146)/(90-98) 115/90 (09/14 1132) SpO2:  [90 %-97 %] 97 % (09/14 1132) Last BM Date : 04/04/24 General: NAD Lungs: Bilateral clear Heart: s1s2  Abdomen: Soft mild distention, nontender, normal bowel sounds Ext: Mild edema    Intake/Output from previous day: 09/13 0701 - 09/14 0700 In: 866 [P.O.:716; IV Piggyback:150] Out: 2250 [Urine:2250] Intake/Output this shift: Total I/O In: -  Out: 700 [Urine:700]   Lab Results: Recent Labs    04/03/24 1400 04/03/24 1457 04/04/24 0551 04/05/24 0206  WBC 10.3  --  10.7* 12.2*  HGB 13.6 16.0 13.1 12.0*  PLT 182  --  184 173  MCV 96.5  --  95.7 96.2   BMET Recent Labs    04/04/24 1628 04/04/24 2307 04/05/24 0206  NA 119* 123* 124*  K 2.7* 3.3* 3.3*  CL 83* 85* 88*  CO2 24 25 25   GLUCOSE 197* 131* 94  BUN <5* <5* <5*  CREATININE 0.39* 0.38* 0.38*  CALCIUM  7.3* 7.6* 7.8*   LFT Recent Labs    04/04/24 1628 04/04/24 2307 04/05/24 0206  PROT 5.8* 5.6* 6.1*  ALBUMIN  2.5* 2.5* 2.9*  AST 251* 225* 213*  ALT 80* 70* 69*  ALKPHOS 571* 472* 507*  BILITOT 19.4* 20.0* 20.2*   PT/INR Recent Labs    04/04/24 0551 04/05/24 0206  INR 1.0 1.2      Imaging/Other results: US  Paracentesis Result Date: 04/04/2024 INDICATION: 43 year old male who presented to the ED with abdominal distension. Abdominal ultrasound revealed cirrhotic liver and large volume ascites. Request for therapeutic paracentesis. EXAM: ULTRASOUND GUIDED for MEDICATIONS: 1% lidocaine , 9 mL. COMPLICATIONS: None immediate. PROCEDURE: Informed written consent was obtained from the patient after a discussion of the risks, benefits and  alternatives to treatment. A timeout was performed prior to the initiation of the procedure. Initial ultrasound scanning demonstrates a large amount of ascites within the right lower abdominal quadrant. The right lower abdomen was prepped and draped in the usual sterile fashion. 1% lidocaine  was used for local anesthesia. Following this, a 19 gauge, 7-cm, Yueh catheter was introduced. An ultrasound image was saved for documentation purposes. The paracentesis was performed. The catheter was removed and a dressing was applied. The patient tolerated the procedure well without immediate post procedural complication. Patient received post-procedure intravenous albumin ; see nursing notes for details. FINDINGS: A total of approximately 3.1 L of dark yellow fluid was removed. IMPRESSION: Successful ultrasound-guided paracentesis yielding 3.1 liters of peritoneal fluid. Procedure performed by: Sherrilee Bal, PA-C under the supervision of Dr. JONETTA Faes. Electronically Signed   By: JONETTA Faes M.D.   On: 04/04/2024 14:22   US  Abdomen Complete Result Date: 04/03/2024 CLINICAL DATA:  Cholecystitis. EXAM: ABDOMEN ULTRASOUND COMPLETE COMPARISON:  CT abdomen and pelvis 04/03/2024. FINDINGS: Gallbladder: No gallstones identified. There is diffuse gallbladder wall thickening measuring 9 mm. No sonographic Murphy sign noted by sonographer. Common bile duct: Diameter: 5.1 mm Liver: Echogenic and coarse parenchymal echotexture. Nodular contour. Portal vein is patent on color Doppler imaging with normal direction of blood flow towards the liver. IVC: No abnormality visualized. Pancreas: Visualized portion unremarkable. Spleen: Size and appearance within normal limits. Right Kidney: Length: 11.4 cm. Echogenicity within  normal limits. No mass or hydronephrosis visualized. Left Kidney: Length: 12.6 cm. Echogenicity within normal limits. No mass or hydronephrosis visualized. Abdominal aorta: No aneurysm visualized. Limited visualization  secondary to bowel-gas. Other findings: There is large volume ascites, right greater than left. IMPRESSION: 1. Cirrhotic morphology of the liver. 2. Large volume ascites. 3. Diffuse gallbladder wall thickening without evidence of cholelithiasis or sonographic Murphy sign. Findings are nonspecific and may be related to underlying liver disease. Electronically Signed   By: Greig Pique M.D.   On: 04/03/2024 20:50   CT ABDOMEN PELVIS WO CONTRAST Result Date: 04/03/2024 CLINICAL DATA:  Acute abdominal pain EXAM: CT ABDOMEN AND PELVIS WITHOUT CONTRAST TECHNIQUE: Multidetector CT imaging of the abdomen and pelvis was performed following the standard protocol without IV contrast. RADIATION DOSE REDUCTION: This exam was performed according to the departmental dose-optimization program which includes automated exposure control, adjustment of the mA and/or kV according to patient size and/or use of iterative reconstruction technique. COMPARISON:  02/23/2024 FINDINGS: Lower chest: Coronary and aortic atherosclerosis. Mildly elevated right hemidiaphragm with atelectasis along the right diaphragm as well as mild atelectasis in the lingula left lower lobe. Hepatobiliary: Striking diffuse hepatic steatosis. Slightly irregular contour of the liver, cannot exclude cirrhosis or steatohepatitis. Thick-walled gallbladder, nonspecific for inflammation versus hypoproteinemia/hypoalbuminemia. Pancreas: Unremarkable Spleen: Unremarkable Adrenals/Urinary Tract: Unremarkable Stomach/Bowel: Unremarkable Vascular/Lymphatic: Unremarkable Reproductive: Unremarkable Other: New substantial ascites.  Mild mesenteric edema. 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 Appearance unchanged. Fatty bilateral spermatic cords. IMPRESSION: 1. New substantial ascites. 2. Striking diffuse hepatic steatosis. Slightly irregular contour of the  liver, cannot exclude cirrhosis or steatohepatitis. 3. Thick-walled gallbladder, nonspecific for inflammation versus hypoproteinemia/hypoalbuminemia. 4. 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 5.  Aortic Atherosclerosis (ICD10-I70.0). Electronically Signed   By: Ryan Salvage M.D.   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  He never had EGD Will schedule EGD tomorrow with Dr. Stacia to exclude gastroduodenal ulcer or any signs of portal hypertension N.p.o. after midnight  Severe hyponatremia improving  Discussed alcohol cessation   This visit required >35 minutes of patient care (this includes precharting, chart review, review of results, face-to-face time used for counseling as well as treatment plan and follow-up. The patient was provided an opportunity to ask questions and all were answered. The patient agreed with the plan and demonstrated an understanding of the instructions.      LOIS Wilkie Mcgee , MD [phone]  [facility]

## 2024-04-05 NOTE — Plan of Care (Signed)
  Problem: Education: Goal: Individualized Educational Video(s) Outcome: Progressing   Problem: Coping: Goal: Ability to adjust to condition or change in health will improve Outcome: Progressing   Problem: Fluid Volume: Goal: Ability to maintain a balanced intake and output will improve Outcome: Progressing

## 2024-04-05 NOTE — Progress Notes (Signed)
 [facility]   ROUNDING   NOTE Ferd Horrigan FMW:[id number]  DOB: 1980/12/30  DOA: 04/03/2024  PCP: Patient, [name]  04/05/2024,3:36 PM  LOS: 2 days    Code Status:  full     From:  unclear    43 year old homeless Spanish-speaking male Known DM TY 2 not taking meds for 6 months as cannot afford EtOH use habituation 2-3 beers per day-seen in ED 02/23/2024 N/V/persistent emesis Apparently not taking insulin -DDx at the time pancreatitis/gastritis/DKA-EtOH level was 455 lactic 3.6 Patient ambulatory and sent home with resolution of symptoms---   9/12 return [name], ED Abdominal distention nausea-difficulty eating with distention-full feeling-generalized fatigue difficulty walking--- tells me that for 2 weeks he has had fullness in his abdomen as wll as sensitivity in the stomach feeling very weak having dark tarry stool like coffee ground for the past 3 days  Bmet confirms sodium 117, CL 82 Ast/ALT 306/97 baseline about a month ago 256/178-alk phos 623 total bili 16.7 from No INR performed Anion gap is 13 Lipase 64 Ammonia 80 FOBT confirmed as positive  Imaging confirms cirrhosis without cholecystitis 9/13 large-volume paracentesis 3 L    Assessment  & Plan :    Alcoholic hepatitis discriminant score 6, MELD score 24 Defer to GI further workup--tremors amanita's is improving hyperbilirubinemia worsening Ascites without SBP based on ascitic tap Severe hypervolemic hyponatremia DDx tea toast potomania with volume overload Ascitic tap shows less than 250 PMN-no need for antibiotics LVP as above Status post multiple doses albumin  1 dose Lasix -increasing to Lasix  40 twice daily Discussed earlier with nephrology plan of care-May need to formally involve them if continues to drop sodium in the next 24 hours Change sodium checks to every 12 once reliably above 125 Hypokalemia Give K. Dur 40 twice daily, add 4 runs of potassium, checking mag and replace Will give IV calcium  gluconate 2 g  now ?  GI bleed upper Hemoccult positive on admission-hemoglobin however staying in the 13 range No plans from GI for scope-no further dark stools-graduate to soft diet and observe tolerance, symptoms of GI bleed Risk alcohol withdrawal Patient on Ativan  protocol-observe DM TY 2 noncompliant secondary to inability to get meds Noncompliant X 6 months CBG 90-1 79 current meds-2 units 3 times daily meals, Lantus  4, moderate sliding scale  Chronic homelessness   Data Reviewed:   Sodium 119 potassium 3.0 calcium  7.7 AST/ALT diminished to 216/74 Total bili 24 INR 1.2   DVT prophylaxis: SCD  Status is: Inpatient Remains inpatient appropriate because:   Requires improvement in labs etc.     Current Dispo: Inpatient     Subjective:   Coherent pleasant no dark stool no tarry stools-now tells me has not had dark stool for several days  No nausea no vomiting wants to eat more     Objective + exam Vitals:   04/05/24 0539 04/05/24 0724 04/05/24 1132 04/05/24 1533  BP: (!) 146/98 (!) 136/96 (!) 115/90 113/86  Pulse: 71 84 69 80  Resp: 16 19 19 18   Temp: 99.6 F (37.6 C) 100.2 F (37.9 C) 99.4 F (37.4 C) 100.3 F (37.9 C)  TempSrc: Oral Oral Oral Oral  SpO2: 92% 95% 97% 95%  Weight:      Height:       Filed Weights   04/03/24 2115  Weight: 77 kg     Examination: Clearly icteric-neck soft supple-sparse hair distribution and face S1-S2 no murmur Abdomen swollen tense ascites no HSM some shifting dullness Trace  lower extremity edema No asterixis no tremor coherent cognizant  Scheduled Meds:  folic acid   1 mg Oral Daily   furosemide   40 mg Intravenous BID   insulin  aspart  0-15 Units Subcutaneous TID WC   insulin  aspart  2 Units Subcutaneous TID WC   insulin  glargine  4 Units Subcutaneous QHS   lidocaine  (PF)  10 mL Intradermal Once   lidocaine -EPINEPHrine   10 mL Intradermal Once   multivitamin with minerals  1 tablet Oral Daily   pantoprazole   40 mg Oral BID    potassium chloride   40 mEq Oral Daily   propranolol   10 mg Oral BID   thiamine   100 mg Oral Daily   Continuous Infusions:  Time 23  Jai-Gurmukh Desa Rech, MD  [facility]

## 2024-04-06 ENCOUNTER — Encounter (HOSPITAL_COMMUNITY): Payer: Self-pay | Admitting: Family Medicine

## 2024-04-06 ENCOUNTER — Inpatient Hospital Stay (HOSPITAL_COMMUNITY): Payer: MEDICAID | Admitting: Anesthesiology

## 2024-04-06 ENCOUNTER — Encounter (HOSPITAL_COMMUNITY)
Admission: EM | Disposition: A | Discharge status: Home / Self Care | Payer: Self-pay | Source: Home / Self Care | Attending: Family Medicine

## 2024-04-06 DIAGNOSIS — I8510 Secondary esophageal varices without bleeding: Secondary | ICD-10-CM

## 2024-04-06 DIAGNOSIS — K920 Hematemesis: Secondary | ICD-10-CM

## 2024-04-06 DIAGNOSIS — E871 Hypo-osmolality and hyponatremia: Secondary | ICD-10-CM

## 2024-04-06 DIAGNOSIS — K2950 Unspecified chronic gastritis without bleeding: Secondary | ICD-10-CM

## 2024-04-06 DIAGNOSIS — I8500 Esophageal varices without bleeding: Secondary | ICD-10-CM

## 2024-04-06 DIAGNOSIS — K2289 Other specified disease of esophagus: Secondary | ICD-10-CM

## 2024-04-06 DIAGNOSIS — K3189 Other diseases of stomach and duodenum: Secondary | ICD-10-CM

## 2024-04-06 DIAGNOSIS — K766 Portal hypertension: Secondary | ICD-10-CM

## 2024-04-06 DIAGNOSIS — B9681 Helicobacter pylori [H. pylori] as the cause of diseases classified elsewhere: Secondary | ICD-10-CM

## 2024-04-06 DIAGNOSIS — K31A15 Gastric intestinal metaplasia without dysplasia, involving multiple sites: Secondary | ICD-10-CM

## 2024-04-06 HISTORY — PX: ESOPHAGOGASTRODUODENOSCOPY: SHX5428

## 2024-04-06 LAB — COMPREHENSIVE METABOLIC PANEL WITH GFR
ALT: 57 U/L — ABNORMAL HIGH (ref 0–44)
AST: 149 U/L — ABNORMAL HIGH (ref 15–41)
Albumin: 2.9 g/dL — ABNORMAL LOW (ref 3.5–5.0)
Alkaline Phosphatase: 433 U/L — ABNORMAL HIGH (ref 38–126)
Anion gap: 11 (ref 5–15)
BUN: 7 mg/dL (ref 6–20)
CO2: 25 mmol/L (ref 22–32)
Calcium: 8.2 mg/dL — ABNORMAL LOW (ref 8.9–10.3)
Chloride: 90 mmol/L — ABNORMAL LOW (ref 98–111)
Creatinine, Ser: 0.44 mg/dL — ABNORMAL LOW (ref 0.61–1.24)
GFR, Estimated: >60 mL/min (ref >60)
Glucose, Bld: 143 mg/dL — ABNORMAL HIGH (ref 70–99)
Potassium: 3.2 mmol/L — ABNORMAL LOW (ref 3.5–5.1)
Sodium: 126 mmol/L — ABNORMAL LOW (ref 135–145)
Total Bilirubin: 24.1 mg/dL (ref 0.0–1.2)
Total Protein: 5.8 g/dL — ABNORMAL LOW (ref 6.5–8.1)

## 2024-04-06 LAB — GLUCOSE, CAPILLARY
Glucose-Capillary: 152 mg/dL — ABNORMAL HIGH (ref 70–99)
Glucose-Capillary: 124 mg/dL — ABNORMAL HIGH (ref 70–99)
Glucose-Capillary: 291 mg/dL — ABNORMAL HIGH (ref 70–99)
Glucose-Capillary: 208 mg/dL — ABNORMAL HIGH (ref 70–99)

## 2024-04-06 SURGERY — EGD (ESOPHAGOGASTRODUODENOSCOPY)
Anesthesia: Monitor Anesthesia Care

## 2024-04-06 MED ORDER — MUPIROCIN 2 % EX OINT
1.0000 | TOPICAL_OINTMENT | Freq: Two times a day (BID) | CUTANEOUS | Status: AC
  Administered 2024-04-06 – 2024-04-10 (×10): 1 via NASAL
  Filled 2024-04-06 (×5): qty 22

## 2024-04-06 MED ORDER — PROPOFOL 500 MG/50ML IV EMUL
INTRAVENOUS | Status: DC | PRN
  Administered 2024-04-06: 30 mg via INTRAVENOUS
  Administered 2024-04-06: 20 mg via INTRAVENOUS
  Administered 2024-04-06: 200 ug/kg/min via INTRAVENOUS

## 2024-04-06 MED ORDER — PHENYLEPHRINE 80 MCG/ML (10ML) SYRINGE FOR IV PUSH (FOR BLOOD PRESSURE SUPPORT)
PREFILLED_SYRINGE | INTRAVENOUS | Status: DC | PRN
  Administered 2024-04-06 (×2): 120 ug via INTRAVENOUS

## 2024-04-06 MED ORDER — SODIUM CHLORIDE 0.9 % IV SOLN: INTRAVENOUS | Status: DC | PRN

## 2024-04-06 MED ORDER — INSULIN ASPART 100 UNIT/ML IJ SOLN
4.0000 [IU] | Freq: Three times a day (TID) | INTRAMUSCULAR | Status: DC
  Administered 2024-04-06 – 2024-04-07 (×3): 4 [IU] via SUBCUTANEOUS

## 2024-04-06 MED ORDER — PREDNISOLONE 5 MG PO TABS
40.0000 mg | ORAL_TABLET | Freq: Every day | ORAL | Status: DC
Start: 2024-04-06 — End: 2024-05-04
  Administered 2024-04-06 – 2024-04-10 (×5): 40 mg via ORAL
  Filled 2024-04-06 (×6): qty 8

## 2024-04-06 MED ORDER — LIDOCAINE HCL (CARDIAC) PF 100 MG/5ML IV SOSY
PREFILLED_SYRINGE | INTRAVENOUS | Status: DC | PRN
Start: 2024-04-06 — End: 2024-04-06
  Administered 2024-04-06: 60 mg via INTRAVENOUS

## 2024-04-06 MED ORDER — MAGNESIUM SULFATE 4 GM/100ML IV SOLN
4.0000 g | Freq: Once | INTRAVENOUS | Status: DC
Start: 2024-04-06 — End: 2024-04-12

## 2024-04-06 NOTE — TOC Initial Note (Addendum)
 Transition of Care [facility]) - Initial/Assessment Note    Patient Details  Name: Tanner Cooper MRN: [id number] Date of Birth: Jan 31, 1981  [facility] CM/SW Contact:    Lauraine FORBES Saa, LCSWA Phone Number: 04/06/2024, 4:04 PM  Clinical Narrative:                  4:04 PM CSW introduced self and role to patient. CSW used medical interpreter Warren [phone]) for conversation. Patient confirmed that he is currently experiencing homelessness. Patient stated that he currently does not have a source of income. Patient stated that he does not receive food stamps or SSI. Patient confirmed that he does not have SNF/HH/DME history. Patient informed CSW that he uses the bus or walks to attend appointments. Patient accepted CSW offer of resources (food stamps, housing, transportation). Patient confirmed interest in medication assistance and PCP at a community health clinic in [place] (any days in the afternoons) in efforts to avoid copays. CSW relayed interest to [facility]. CSW consulted financial counseling for insurance needs. TOC will continue to follow and be available to assist.  Expected Discharge Plan: Homeless Shelter Barriers to Discharge: Continued Medical Work up   Patient Goals and CMS Choice            Expected Discharge Plan and Services In-house Referral: Clinical Social Work Discharge Planning Services: CM Consult   Living arrangements for the past 2 months: Homeless, Homeless Shelter                                      Prior Living Arrangements/Services Living arrangements for the past 2 months: Homeless, Homeless Shelter Lives with:: Self Patient language and need for interpreter reviewed:: Yes                 Activities of Daily Living   ADL Screening (condition at time of admission) Independently performs ADLs?: Yes (appropriate for developmental age) Is the patient deaf or have difficulty hearing?: No Does the patient have difficulty  seeing, even when wearing glasses/contacts?: No Does the patient have difficulty concentrating, remembering, or making decisions?: No  Permission Sought/Granted                  Emotional Assessment Appearance:: Appears stated age Attitude/Demeanor/Rapport: Engaged Affect (typically observed): Accepting, Appropriate, Adaptable, Calm, Stable, Pleasant Orientation: : Oriented to Self, Oriented to Place, Oriented to  Time, Oriented to Situation Alcohol / Substance Use: Not Applicable Psych Involvement: No (comment)  Admission diagnosis:  Alcoholic hepatitis [K70.10] Hyponatremia [E87.1] ETOH abuse [F10.10] General weakness [R53.1] Acute liver failure [K72.00] Cirrhosis of liver without ascites, unspecified hepatic cirrhosis type (HCC) [K74.60] Patient Active Problem List   Diagnosis Date Noted   Secondary esophageal varices without bleeding (HCC) 04/06/2024   Portal hypertensive gastropathy (HCC) 04/06/2024   ETOH abuse 04/04/2024   Hyponatremia 04/04/2024   Metabolic dysfunction-associated steatohepatitis (MASH) 04/04/2024   Alcoholic hepatitis 04/03/2024   Acute liver failure 04/03/2024   PCP:  Patient, [name] Pharmacy:   [id number] [address] [place], [place] - [address] AT [facility] [address] [place] [address] Phone: [phone] Fax: [phone]  [name] [address] [place] [address] Phone: [phone] Fax: [phone]     Social Drivers of Health (SDOH) Social History: SDOH Screenings   Food Insecurity: Food Insecurity  Present (04/04/2024)  Housing: High Risk (04/04/2024)  Transportation Needs: Unmet Transportation Needs (04/04/2024)  Utilities: Patient Declined (04/04/2024)  Tobacco Use: Low Risk  (04/06/2024)   SDOH Interventions: Food Insecurity Interventions: [employer] Provided, Inpatient TOC Housing Interventions: [employer]  Provided, Inpatient [employer] Transportation Interventions: [employer] Provided, Inpatient TOC   Readmission Risk Interventions     No data to display

## 2024-04-06 NOTE — Anesthesia Preprocedure Evaluation (Signed)
 Anesthesia Evaluation  Patient identified by MRN, date of birth, ID band Patient awake  General Assessment Comment:  Denies nausea or vomiting today. Says his last vomit was about a week ago. Ate dinner last night and tolerated it without issues.  Reviewed: Allergy & Precautions, NPO status , Patient's Chart, lab work & pertinent test results  History of Anesthesia Complications Negative for: history of anesthetic complications  Airway Mallampati: III  TM Distance: >3 FB Neck ROM: Full    Dental  (+) Poor Dentition, Chipped   Pulmonary neg pulmonary ROS, neg sleep apnea, neg COPD, Patient abstained from smoking.Not current smoker   Pulmonary exam normal breath sounds clear to auscultation       Cardiovascular Exercise Tolerance: Good METS(-) hypertension(-) CAD and (-) Past MI negative cardio ROS (-) dysrhythmias  Rhythm:Regular Rate:Normal - Systolic murmurs    Neuro/Psych negative neurological ROS     GI/Hepatic ,neg GERD  ,,(+) Cirrhosis   ascites  (-) substance abuse  , Hepatitis -, Toxin RelatedS/p 3L paracentesis two days ago   Endo/Other  neg diabetes    Renal/GU negative Renal ROS     Musculoskeletal   Abdominal   Peds  Hematology   Anesthesia Other Findings   Reproductive/Obstetrics                              Anesthesia Physical Anesthesia Plan  ASA: 4  Anesthesia Plan: MAC   Post-op Pain Management: Minimal or no pain anticipated   Induction: Intravenous  PONV Risk Score and Plan: 1 and Propofol  infusion, TIVA and Ondansetron   Airway Management Planned: Nasal Cannula  Additional Equipment: None  Intra-op Plan:   Post-operative Plan:   Informed Consent: I have reviewed the patients History and Physical, chart, labs and discussed the procedure including the risks, benefits and alternatives for the proposed anesthesia with the patient or authorized  representative who has indicated his/her understanding and acceptance.     Dental advisory given  Plan Discussed with: CRNA and Surgeon  Anesthesia Plan Comments: (Discussed risks of anesthesia with patient, including possibility of difficulty with spontaneous ventilation under anesthesia necessitating airway intervention, aspiration requiring airway intervention, PONV, and rare risks such as cardiac or respiratory or neurological events, and allergic reactions. Discussed the role of CRNA in patient's perioperative care. Patient understands.)         Anesthesia Quick Evaluation

## 2024-04-06 NOTE — Progress Notes (Addendum)
 [facility]   ROUNDING   NOTE Tanner Cooper FMW:[id number]  DOB: Nov 06, 1980  DOA: 04/03/2024  PCP: Patient, [name]  04/06/2024,12:07 PM  LOS: 3 days    Code Status:  full     From:  unclear    43 year old homeless Spanish-speaking male Known DM TY 2 not taking meds for 6 months as cannot afford EtOH use habituation 2-3 beers per day-seen in ED 02/23/2024 N/V/persistent emesis Apparently not taking insulin -DDx at the time pancreatitis/gastritis/DKA-EtOH level was 455 lactic 3.6 Patient ambulatory and sent home with resolution of symptoms---   9/12 return [name], ED Abdominal distention nausea-difficulty eating with distention-full feeling-generalized fatigue difficulty walking--- tells me that for 2 weeks he has had fullness in his abdomen as wll as sensitivity in the stomach feeling very weak having dark tarry stool like coffee ground for the past 3 days  Bmet confirms sodium 117, CL 82 Ast/ALT 306/97 baseline about a month ago 256/178-alk phos 623 total bili 16.7 from No INR performed Anion gap is 13 Lipase 64 Ammonia 80 FOBT confirmed as positive  Imaging confirms cirrhosis without cholecystitis 9/13 large-volume paracentesis 3 L 9/15 endoscopy grade 1 esophageal varices not high risk, GAVE biopsied   Assessment  & Plan :    Alcoholic hepatitis discriminant score 6, MELD score 24 GI suggesting prednisone 40X 4 weeks As per them Ascites without SBP based on ascitic tap Severe hypervolemic hyponatremia DDx tea toast potomania with volume overload Ascitic tap shows less than 250 PMN-no need for antibiotics LVP as above Continue IV Lasix  40 twice daily--Will add Aldactone  and convert to oral 5:2 ratio tomorrow Is net -1.6 L since admission weight is stable-standing weights Received albumin  throughout hospital stay last doses on 9/14 Restrict fluids to 1200 cc ?  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 Had this initially now it is resolved DM TY 2 noncompliant secondary to inability to get meds Noncompliant X 6 months CBG  ranging 140-242 sliding scale and 4 units requiring Lantus  Since starting steroids we will need to increase insulin  to 4 units 3 times daily meals will adjust long-acting etc. in the next 24 hours Chronic homelessness TOC input listed today he will need significant help affording meds etc.   Data Reviewed:   Sodium 126 potassium 3.2 chloride 90 BUN/creatinine 7/0.44 AST/ALT 149/57 Calcium  8.2 Magnesium  previously 1.6 Bilirubin 24  Hemoglobin 12.0 platelet 173 WBC 12.2   DVT prophylaxis: SCD  Status is: Inpatient Remains inpatient appropriate because:   Requires improvement in labs etc.     Current Dispo: Inpatient     Subjective:    No bleeding no vomit no cough His stomach feels normal He is quite hungry We discussed briefly use of steroids I will go over this with him again later  Objective + exam Vitals:   04/06/24 1120 04/06/24 1124 04/06/24 1130 04/06/24 1139  BP: (!) 84/65 97/75 105/77 90/67  Pulse: 74 81 72 61  Resp: 13 13 17 16   Temp:    98.7  F (37.1 C)  TempSrc:    Oral  SpO2: 97% 99% 99% 99%  Weight:      Height:       Filed Weights   04/03/24 2115 04/06/24 0839  Weight: 77 kg 77 kg     Examination:  EOMI NCAT icteric Neck soft supple Chest clear Abdomen distended No lower extremity edema No asterixis  Scheduled Meds:  folic acid   1 mg  Oral Daily   furosemide   40 mg Intravenous BID   insulin  aspart  0-15 Units Subcutaneous TID WC   insulin  aspart  2 Units Subcutaneous TID WC   insulin  glargine  4 Units Subcutaneous QHS   lidocaine  (PF)  10 mL Intradermal Once   lidocaine -EPINEPHrine   10 mL Intradermal Once   multivitamin with minerals  1 tablet Oral Daily   pantoprazole   40 mg Oral BID   potassium chloride   40 mEq Oral Daily   prednisoLONE   40 mg Oral Daily   propranolol   10 mg Oral BID   thiamine   100 mg Oral Daily   Continuous Infusions:  magnesium  sulfate bolus IVPB     Time 40  Jai-Gurmukh Megahn Killings, MD  [facility]

## 2024-04-06 NOTE — Anesthesia Postprocedure Evaluation (Signed)
 Anesthesia Post Note  Patient: Elvert Cumpton  Procedure(s) Performed: EGD (ESOPHAGOGASTRODUODENOSCOPY)     Patient location during evaluation: Endoscopy Anesthesia Type: MAC Level of consciousness: awake and alert Pain management: pain level controlled Vital Signs Assessment: post-procedure vital signs reviewed and stable Respiratory status: spontaneous breathing, nonlabored ventilation, respiratory function stable and patient connected to nasal cannula oxygen Cardiovascular status: blood pressure returned to baseline and stable Postop Assessment: no apparent nausea or vomiting Anesthetic complications: no   No notable events documented.  Last Vitals:  Vitals:   04/06/24 1124 04/06/24 1130  BP: 97/75 105/77  Pulse: 81 72  Resp: 13 17  Temp:    SpO2: 99% 99%    Last Pain:  Vitals:   04/06/24 1124  TempSrc:   PainSc: 0-No pain                 Rome Ade

## 2024-04-06 NOTE — Interval H&P Note (Signed)
 History and Physical Interval Note:  04/06/2024 9:36 AM  Tanner Cooper  has presented today for surgery, with the diagnosis of ETOH hepatitis , N/V, coffee ground emesis.  The various methods of treatment have been discussed with the patient and family. After consideration of risks, benefits and other options for treatment, the patient has consented to  Procedure(s): EGD (ESOPHAGOGASTRODUODENOSCOPY) (N/A) as a surgical intervention.  The patient's history has been reviewed, patient examined, no change in status, stable for surgery.  I have reviewed the patient's chart and labs.  Questions were answered to the patient's satisfaction.     Glendia FORBES Holt

## 2024-04-06 NOTE — Transfer of Care (Signed)
 Immediate Anesthesia Transfer of Care Note  Patient: Tanner Cooper  Procedure(s) Performed: EGD (ESOPHAGOGASTRODUODENOSCOPY)  Patient Location: PACU  Anesthesia Type:MAC  Level of Consciousness: sedated  Airway & Oxygen Therapy: Patient Spontanous Breathing  Post-op Assessment: Report given to RN  Post vital signs: Reviewed and stable  Last Vitals:  Vitals Value Taken Time  BP 84/65 04/06/24 11:00  Temp    Pulse 78 04/06/24 11:01  Resp 12 04/06/24 11:01  SpO2 100 % 04/06/24 11:01  Vitals shown include unfiled device data.  Last Pain:  Vitals:   04/06/24 1058  TempSrc: Temporal  PainSc: Asleep         Complications: No notable events documented.

## 2024-04-06 NOTE — Plan of Care (Signed)
  Problem: Education: Goal: Ability to describe self-care measures that may prevent or decrease complications (Diabetes Survival Skills Education) will improve Outcome: Progressing   Problem: Coping: Goal: Ability to adjust to condition or change in health will improve Outcome: Progressing   Problem: Fluid Volume: Goal: Ability to maintain a balanced intake and output will improve Outcome: Progressing   Problem: Health Behavior/Discharge Planning: Goal: Ability to manage health-related needs will improve Outcome: Progressing   Problem: Metabolic: Goal: Ability to maintain appropriate glucose levels will improve Outcome: Progressing   Problem: Education: Goal: Knowledge of General Education information will improve Description: Including pain rating scale, medication(s)/side effects and non-pharmacologic comfort measures Outcome: Progressing   Problem: Clinical Measurements: Goal: Diagnostic test results will improve Outcome: Progressing

## 2024-04-06 NOTE — Op Note (Signed)
 [facility] Patient Name: Tanner Cooper Procedure Date : 04/06/2024 MRN: [id number] Attending MD: Glendia BRAVO. Stacia , MD, [id number] Date of Birth: 1981/03/10 CSN: [id number] Age: 43 Admit Type: Inpatient Procedure:                Upper GI endoscopy Indications:              Coffee-ground emesis Providers:                Glendia E. Stacia, MD, Hoy Penner, RN, Felice Sar, Technician Referring MD:              Medicines:                Monitored Anesthesia Care Complications:            No immediate complications. Estimated Blood Loss:     Estimated blood loss was minimal. Procedure:                Pre-Anesthesia Assessment:                           - Prior to the procedure, a History and Physical                            was performed, and patient medications and                            allergies were reviewed. The patient's tolerance of                            previous anesthesia was also reviewed. The risks                            and benefits of the procedure and the sedation                            options and risks were discussed with the patient.                            All questions were answered, and informed consent                            was obtained. Prior Anticoagulants: The patient has                            taken no anticoagulant or antiplatelet agents. ASA                            Grade Assessment: IV - A patient with severe                            systemic disease that is a constant threat to life.  After reviewing the risks and benefits, the patient                            was deemed in satisfactory condition to undergo the                            procedure.                           After obtaining informed consent, the endoscope was                            passed under direct vision. Throughout the                            procedure, the patient's  blood pressure, pulse, and                            oxygen saturations were monitored continuously. The                            [id number]-[id number] ([id number]) Olympus endoscope was introduced                            through the mouth, and advanced to the second part                            of duodenum. The upper GI endoscopy was                            accomplished without difficulty. The patient                            tolerated the procedure well. Scope In: Scope Out: Findings:      The examined portions of the nasopharynx, oropharynx and larynx were       normal.      Grade I varices were found in the lower third of the esophagus. They       were small in size.      The Z-line was irregular.      The exam of the esophagus was otherwise normal.      Mild, diffuse portal hypertensive gastropathy was found in the gastric       fundus and in the gastric body. Biopsies were taken with a cold forceps       for Helicobacter pylori testing. Estimated blood loss was minimal.      The exam of the stomach was otherwise normal.      The examined duodenum was normal. Impression:               - The examined portions of the nasopharynx,                            oropharynx and larynx were normal.                           -  Grade I esophageal varices. Not high risk. No                            suspicion as bleeding source.                           - Z-line irregular.                           - Portal hypertensive gastropathy. Biopsied.                           - Normal examined duodenum. Moderate Sedation:      N/A Recommendation:           - Return patient to hospital ward for ongoing care.                           - Resume previous diet.                           - Continue present medications.                           - Await pathology results.                           - Recommend starting carvedilol 6.25 mg once daily                            for variceal bleeding  prophylaxis. This can be                            started at discharge.                           - Consider starting prednisolone  for alcoholic                            hepatitis. Although his DF is not >32 when the                            control PT time is 15, it is >32 when a control PT                            time of 12 is used (our lab quotes a normal                            prothrombin range of 11.4-15.2) Procedure Code(s):        --- Professional ---                           [phone], Esophagogastroduodenoscopy, flexible,                            transoral; with biopsy, single or multiple Diagnosis Code(s):        ---  Professional ---                           I85.00, Esophageal varices without bleeding                           K22.89, Other specified disease of esophagus                           K76.6, Portal hypertension                           K31.89, Other diseases of stomach and duodenum                           K92.0, Hematemesis CPT copyright 2022 [employer]. All rights reserved. The codes documented in this report are preliminary and upon coder review may  be revised to meet current compliance requirements. Curly Mackowski E. Stacia, MD 04/06/2024 11:05:32 AM This report has been signed electronically. Number of Addenda: 0

## 2024-04-06 NOTE — Plan of Care (Signed)

## 2024-04-06 NOTE — Discharge Instructions (Addendum)
 [place]  MINISTERIO ROMUALDO DE [place] Direccin: [address] [place], [place] [address] Debroah sheerer telfono:  [phone] Horario de atencin:  Los residentes del condado de [place] pueden venir a buscar alimentos de lunes a viernes de 8:30 a. m. a 3:30 p. m. Se requiere identificacin con fotografa y tarjetas de Seguro Social para todos los residentes de [place]. Pueden venir seis veces al ao.  LA MESA BENDITA Direccin:  [address] [place], [place] [address] Nmero de telfono:  [phone] Horario de atencin:  funciona de martes a viernes de 10:00 a. m. a 1:00 p. m. Requisitos: se necesita una recomendacin del DSS. Puede venir 6 veces al ao, con [address]  [address] de [address]. Se requiere identificacin con fotografa y nmero de seguro social para todos los residentes del hogar.  MANUELLA SHEERER MEUSE DE FE Direccin:  [address] [place], [place] [address] Nmero de telfono:  [phone] Horario de atencin:  La despensa de alimentos est abierta el ltimo sbado de cada mes de 10:00 a. m. a 12:00 p. m. No se necesita cita previa. No se requieren requisitos.  IGLESIA PRESBITERIANA DE [place] Direccin:  [address] [place], [place] [address] Nmero de telfono: [phone]. [phone] Horario de atencin:  Es Oceanographer para Education officer, museum comida los sbados. Las inscripciones para recoger comida los sbados comienzan a las 8:30 a. m. del lunes por la maana.  IGLESIA CATLICA SAN PABLO APSTOL Direccin:  [address] [place], [place] [address] Nmero de telfono:  [phone] Horario de atencin:  Si necesita alimentos, traiga una identificacin [place], como una licencia de conducir, para recibir Physiological scientist bolsa de alimentos una vez al mes. Requisitos: Puede venir una vez cada [address] con una derivacin del DSS, el Ejrcito de Salvacin, salud mental, etc. Cada derivacin es vlida para seis visitas. Se requiere identificacin con fotografa. *No se  requiere derivacin para la primera visita.  IGLESIA CAPRICE ZELMA SHEERER YANCY Direccin:  [address] [place], [place] [address] Nmero de telfono:  [phone]  MAUDRY SHEERER CLINT RONELLE DE [place] Direccin:  [address] [place], [place] [address] Nmero de telfono:  [phone] Horario de atencin:   Puede registrarse en [email or web address] para obtener alimentos gratis  DESPENSA DE IRVING KELLS DE INDEED Direccin:  [address] [place], [place] [address] Nmero de telfono:  [phone] Horario de atencin:  Thea en el auto, por orden de llegada. Cada tercer sbado de 11 a. m. a 1 p. m.  [address] Direccin:  [address] [place], [place] [address] Nmero de telfono:  [phone]  [place]  DESPENSA DE ALIMENTOS MANO A MANO Direccin:  [address] [place], [place] [address] Nmero de telfono:  [phone] Horario de atencin:  una vez al mes, cada tercer sbado  IGLESIA DEL RENACIMIENTO Direccin:  [address] [place], [place] [address] Nmero de telfono:  [phone] Horario de atencin:  La distribucin se realiza de 9:00 a 10:00 a. m. todos los sbados.     MANOS AYUDANTES Direccin:  [address] [place], [place] [address] Nmero de telfono:  [phone] Horario de atencin:  UNA VEZ por semana, la distribucin de alimentos a la comunidad se lleva a cabo todos los [place], mircoles y [place] de 11 a. m. a 2 p. m. Los alimentos estn disponibles por orden de llegada y varan de ignacia semana a [place]. No es necesario hacer cita para recogerlos en el auto.  IGLESIA METODISTA UNIDA [facility]IAL Direccin:  [address] [place], [place] [address] Nmero de telfono:  [phone] Horario de atencin:  Abierto cada tercer jueves de 9:30 a. m. a 11:00 a. m.  CENTRO DE EXTENSIN DE LA IGLESIA HOPE Direccin:  [address] [place], [place] [address] Nmero de telfono:  [phone] Horario de atencin:  llame para consultar  horarios, direcciones y hacer preguntas.  MARILOU BRAMBLE DE GRAN [place] Direccin: [address], [place], [place] [address] Debroah sheerer telfono: [phone] Sitio web: https://www.Hollyguns.co.za Aplicacin Buscador de alimentos: https://findfood.ghpfa.org  SERVICIOS DE CUIDADO[employer] INC. Direccin:  [address] [place], [place] [address] Nmero de telfono:  [phone] Horario de atencin:  comunquese con [employer]. Solo clientes registrados por abuso de sustancias  IGLESIA METODISTA UNIDA DE [place] Direccin:  [address] [place], [address]  Nmero de telfono: [phone] Horario de atencin:  comunquese con Bartley Irving. La despensa de alimentos abre el tercer sbado de cada mes de 9 a. m. a 12 p. m. nicamente.  CENTRO CRISTIANO DE [place] Direccin: [address] [place], [place] [address] Nmero de telfono: [phone] Horario de atencin:  comunquese con Geni Lee. El banco de alimentos de emergencia abre los sbados solo con cita previa.  CENTRO DE RECURSOS FAMILIARES DE MACEDONIA Direccin: [address] [place], [place] [address] Nmero de telfono: [phone] Horario de atencin:  No hay una persona de Research scientist (life sciences); cualquiera puede ayudar  MINISTERIOS DEL WEST END, INC[facility] Direccin: [address] [place], [place] [address] Nmero de telfono: [phone] Horario de atencin:  comunquese con Medford Molt. La agencia entrega una bolsa de comida todos los jueves de 2 a 4 p. m. nicamente, y tambin ofrece una comida comunitaria todos los jueves de 5 a 6 p. m. Otros servicios que brinda incluyen asistencia con el alquiler/hipoteca y los servicios pblicos, refugio de invierno para [place], se[place]chelles de [place] antilles y [place] para Artist.  MINISTERIO DE PUERTAS ABIERTAS DE [place] Direccin: [address] [place], [place] [address] Nmero de telfono: [phone] Horario de atencin:  El Programa de asistencia alimentaria de  emergencia proporciona a las personas y familias un generoso suministro de alimentos que incluye carne, verduras frescas y productos no perecederos. La caja de alimentos contiene alimentos para [address] y cada familia o individuo puede recibir una caja una vez al mes. Lunes, mircoles, jueves y viernes de 11 a. m. a 2 p. m., se aceptan personas sin cita previa.  SERVICIOS DE SALUD DE [facility] AGENCIA DE ANEMIA FALCIFORME Direccin: [address] [place], [place] [address]  Nmero de telfono: [phone] Horario de atencin:  Comunquese con [place]. Martes y [place] de 11 a. m. a 3 p. m., solo con cita previa.  Lista de refugios para personas sin hogar:  [facility] [place] (REFUGIO [facility]) [address] [place], [place] Telfono: [phone]  Refugio para Hombres [facility] [address], [place], [place] [address] Telfono: [phone]  Francis sheerer Raring (Solo para mujeres) [address] [place], [place] [address] Telfono: [phone]  [facility] [place] [address], [place] [address] Telfono: [phone]  [facility] [place] del [place] de Salvacin: [address] [place], [place] [address] Telfono: [phone]  Refugio de Desbordamiento del Ministerio [facility] [address], [place], [place] [address] (Regstrese a las 18:00 h para ser [employer] en un refugio local) Telfono: [phone]   SERVICIOS DE [facility]RAY[facility] GRADE MOVILIDAD DEL CONDADO  DE [place]  [address], [place], [place] [address] [phone] (Principal: Reservaciones) [phone] (Principal: Administracin) Lun 8:00am - 5:00pm; Mar 8:00am - 5:00pm; Mi 8:00am - 5:00pm; Jue 8:00am - 5:00pm; Vie 8:00am - 5:00pm; Sitio [email or web address] Elegibilidad: Personas que viven fuera de los lmites de las ciudades de  [place], [address] y [place]. Tambin atiende a adultos mayores de 60 aos o personas con discapacidad en cualquier parte del condado de [place]. Algunos servicios pueden tener requisitos de elegibilidad adicionales. Honorarios: Vara segn el servicio y la ruta. Aplicar: Llame para verificar su elegibilidad. Visite el sitio web para ms informacin. Las reservas deben hacerse antes de las 12:00 p. m. del da hbil anterior al que se necesite el transporte. Las solicitudes de boletos de autobs deben solicitarse con 3 das de anticipacin. Documentos: Vara segn el servicio  ACCESO [place] AGENCIA DE TRNSITO DE [place]  [address], [place], [place] [address] [phone] (Principal: Austine cherry [place]) [phone] (Principal: Solicitudes de Acceso a la [place]) [phone] (Principal: Hyman everitt Felty de Acceso a la [place]) [phone] (Principal: Lnea de Reservas de I-Ride) Servicio al Cliente de Access [place]: Lunes a viernes, de 8:00 a. m. a 5:00 p. m. Horario de Servicio de Access [place]: Lunes a viernes, de 5:00 a. m. a 11:30 p. m. Sbados y domingos, de 6:00 a. m. a 10:00 p. m. Servicio al Cliente de I-Ride: Lunes a viernes, de 7:00 a. m. a 7:00 p. m. Sbados y domingos, de 9:00 a. m. a 5:00 p. m. Horario de Servicio de I-Ride: Lunes a viernes, de 5:30 a. m. a 10:00 p. m. Sbados y domingos, de 7:30 a. m. a 7:30 p. m. Sitio web: http://www.[place].gov/departments/transit/access-gso-and-i-ride-paratransit-services Elegibilidad: Personas con discapacidad que no pueden utilizar el servicio de autobs de ruta fija. Se admiten animales de servicio. Honorarios: Vara Metallurgist. Visite el sitio web para obtener ms informacin. Aplicar: Visite el sitio web para descargar la solicitud o llame para que le enven una por correo. La segunda parte de la solicitud requiere una evaluacin por parte de un profesional mdico colegiado. Entregue la solicitud en persona o por  correo. Se programar una entrevista. Access [place]: Las reservas deben hacerse antes de las 17:00 h con un da de antelacin y hasta con 7 das de antelacin. Puede reservar llamando o a travs del Portal de Pasajeros de [employer] . I-Ride: El servicio para el mismo da est disponible con al menos 2 horas de antelacin, de lunes a viernes. Puede reservar llamando. Es necesario tener el ID de cliente de [employer]. Documentos: Identificacin con fotografa, comprobante de discapacidad y comprobante de domicilio.  SERVICIOS DE AUTOBUSES LOCALES AGENCIA DE TRNSITO DE [place]  [address], [place], [place] [address] [phone] (Principal: Austine cherry [place]) [phone] (Principal: Informacin de Ruta en Tiempo Real) Servicio de autobs: lunes a viernes, de 5:00 a. m. a 11:30 p. m., sbados y domingos, de 6:00 a. m. a 10:00 p. m. Atencin al cliente: lunes a viernes, de 6:00 a. m. a 8:00 p. m., sbados, de 9:00 a. m. a 6:00 p. m. Sitio web: http://www.[place].gov/departments/transit Elegibilidad: Abierto a todos. Honorarios: Vara segn el servicio y la ruta. Puede haber tarifas con descuento y abonos mensuales disponibles. Aplicar: Llame, enve un mensaje de texto o visite el sitio web para encontrar informacin sobre la ruta. Documentos: No se aplica a pasajeros generales. Los pasajeros con descuento podran necesitar otros documentos. Para  ms informacin, llame.  AUTORIDAD DE [place] REGIONAL DEL PIAMONTE AUTORIDAD DE [facility] REGIONAL DEL PIAMONTE  [address] [place], [place] [address] [phone] (Principal: Ezella sheerer [place] regional) [phone] (Principal: Oficina administrativa) El horario de los autobuses vara segn el servicio y la ruta. Para ms informacin, llame o visite el sitio web. Oficina administrativa : lunes a viernes, de 8:30 a. m. a 5:00 p. m. Centro de atencin telefnica : lunes a viernes, de 6:30 a. m. a 8:00  p. m. y sbados, de 7:00 a. m. a 6:30 p. m. Sitio [email or web address] Elegibilidad: Abierto a todos. Honorarios: Vara segn el servicio y la ruta. Puede haber tarifas con descuento y abonos mensuales disponibles. Aplicar: Llame, descargue la aplicacin o visite el sitio web para Librarian, academic. Documentos: No se aplica a pasajeros generales. Los pasajeros con descuento podran necesitar otros documentos. Para ms informacin, llame.

## 2024-04-07 ENCOUNTER — Encounter (HOSPITAL_COMMUNITY): Payer: Self-pay | Admitting: Gastroenterology

## 2024-04-07 DIAGNOSIS — I8510 Secondary esophageal varices without bleeding: Secondary | ICD-10-CM

## 2024-04-07 LAB — COMPREHENSIVE METABOLIC PANEL WITH GFR
ALT: 63 U/L — ABNORMAL HIGH (ref 0–44)
AST: 131 U/L — ABNORMAL HIGH (ref 15–41)
Albumin: 2.6 g/dL — ABNORMAL LOW (ref 3.5–5.0)
Alkaline Phosphatase: 414 U/L — ABNORMAL HIGH (ref 38–126)
Anion gap: 12 (ref 5–15)
BUN: 12 mg/dL (ref 6–20)
CO2: 24 mmol/L (ref 22–32)
Calcium: 8.1 mg/dL — ABNORMAL LOW (ref 8.9–10.3)
Chloride: 91 mmol/L — ABNORMAL LOW (ref 98–111)
Creatinine, Ser: 0.47 mg/dL — ABNORMAL LOW (ref 0.61–1.24)
GFR, Estimated: >60 mL/min (ref >60)
Glucose, Bld: 276 mg/dL — ABNORMAL HIGH (ref 70–99)
Potassium: 2.9 mmol/L — ABNORMAL LOW (ref 3.5–5.1)
Sodium: 127 mmol/L — ABNORMAL LOW (ref 135–145)
Total Bilirubin: 25.6 mg/dL (ref 0.0–1.2)
Total Protein: 5.9 g/dL — ABNORMAL LOW (ref 6.5–8.1)

## 2024-04-07 LAB — CBC WITH DIFFERENTIAL/PLATELET
Abs Immature Granulocytes: 0.17 K/uL — ABNORMAL HIGH (ref 0.00–0.07)
Basophils Absolute: 0.1 K/uL (ref 0.0–0.1)
Basophils Relative: 1 %
Eosinophils Absolute: 0.1 K/uL (ref 0.0–0.5)
Eosinophils Relative: 1 %
HCT: 36.7 % — ABNORMAL LOW (ref 39.0–52.0)
Hemoglobin: 12.9 g/dL — ABNORMAL LOW (ref 13.0–17.0)
Immature Granulocytes: 1 %
Lymphocytes Relative: 8 %
Lymphs Abs: 1 K/uL (ref 0.7–4.0)
MCH: 35.1 pg — ABNORMAL HIGH (ref 26.0–34.0)
MCHC: 35.1 g/dL (ref 30.0–36.0)
MCV: 99.7 fL (ref 80.0–100.0)
Monocytes Absolute: 1.4 K/uL — ABNORMAL HIGH (ref 0.1–1.0)
Monocytes Relative: 11 %
Neutro Abs: 9.4 K/uL — ABNORMAL HIGH (ref 1.7–7.7)
Neutrophils Relative %: 78 %
Platelets: 244 K/uL (ref 150–400)
RBC: 3.68 MIL/uL — ABNORMAL LOW (ref 4.22–5.81)
RDW: 14.8 % (ref 11.5–15.5)
WBC: 12.1 K/uL — ABNORMAL HIGH (ref 4.0–10.5)
nRBC: 0 % (ref 0.0–0.2)

## 2024-04-07 LAB — BODY FLUID CULTURE W GRAM STAIN
Culture: NO GROWTH
Gram Stain: NONE SEEN

## 2024-04-07 LAB — PROTIME-INR
INR: 1.2 (ref 0.8–1.2)
Prothrombin Time: 15.8 s — ABNORMAL HIGH (ref 11.4–15.2)

## 2024-04-07 LAB — GLUCOSE, CAPILLARY
Glucose-Capillary: 220 mg/dL — ABNORMAL HIGH (ref 70–99)
Glucose-Capillary: 276 mg/dL — ABNORMAL HIGH (ref 70–99)
Glucose-Capillary: 150 mg/dL — ABNORMAL HIGH (ref 70–99)
Glucose-Capillary: 244 mg/dL — ABNORMAL HIGH (ref 70–99)

## 2024-04-07 LAB — SURGICAL PATHOLOGY

## 2024-04-07 LAB — PATHOLOGIST SMEAR REVIEW

## 2024-04-07 MED ORDER — FUROSEMIDE 20 MG PO TABS
20.0000 mg | ORAL_TABLET | Freq: Every day | ORAL | Status: DC
  Administered 2024-04-07 – 2024-04-12 (×6): 20 mg via ORAL
  Filled 2024-04-07 (×6): qty 1

## 2024-04-07 MED ORDER — POTASSIUM CHLORIDE CRYS ER 20 MEQ PO TBCR
40.0000 meq | EXTENDED_RELEASE_TABLET | Freq: Three times a day (TID) | ORAL | Status: AC
  Administered 2024-04-07 – 2024-04-08 (×4): 40 meq via ORAL
  Filled 2024-04-07 (×4): qty 2

## 2024-04-07 MED ORDER — INSULIN ASPART 100 UNIT/ML IJ SOLN
8.0000 [IU] | Freq: Three times a day (TID) | INTRAMUSCULAR | Status: DC
  Administered 2024-04-07 – 2024-04-11 (×11): 8 [IU] via SUBCUTANEOUS

## 2024-04-07 MED ORDER — CALCIUM CARBONATE 1250 (500 CA) MG PO TABS
1.0000 | ORAL_TABLET | Freq: Two times a day (BID) | ORAL | Status: DC
  Administered 2024-04-07 – 2024-04-14 (×14): 1250 mg via ORAL
  Filled 2024-04-07 (×17): qty 1

## 2024-04-07 MED ORDER — SPIRONOLACTONE 25 MG PO TABS
100.0000 mg | ORAL_TABLET | Freq: Every day | ORAL | Status: DC
  Administered 2024-04-07 – 2024-04-12 (×6): 100 mg via ORAL
  Filled 2024-04-07 (×6): qty 4

## 2024-04-07 NOTE — Progress Notes (Signed)
 [place] [facility] ROUNDING NOTE   Subjective: Patient feeling well today.  Didn't sleep much.  Good appetite.  Ate all his breakfast and dinner; no nausea/vomiting/abdominal pain.  No abdominal or leg swelling. Denies head fog.   Objective: Vital signs in last 24 hours: Temp:  [98 F (36.7 C)-98.5 F (36.9 C)] 98 F (36.7 C) (09/16 1107) Pulse Rate:  [71-92] 92 (09/16 1135) Resp:  [15-20] 15 (09/16 1107) BP: (99-114)/(67-83) 111/77 (09/16 1135) SpO2:  [94 %-98 %] 96 % (09/16 1107) Last BM Date : 04/07/24 General: NAD, pleasant Hispanic male Lungs:  CTA b/l, no w/r/r Heart:  RRR, no m/r/g Abdomen:  Soft, NT, ND, +BS Ext:  No c/c/e Neuro:  No asterixis   Intake/Output from previous day: 09/15 0701 - 09/16 0700 In: 869.6 [P.O.:540; I.V.:329.6] Out: 300 [Urine:300] Intake/Output this shift: Total I/O In: 240 [P.O.:240] Out: -    Lab Results: Recent Labs    04/05/24 0206 04/07/24 0823  WBC 12.2* 12.1*  HGB 12.0* 12.9*  PLT 173 244  MCV 96.2 99.7   BMET Recent Labs    04/05/24 1220 04/06/24 0710 04/07/24 0823  NA 119* 126* 127*  K 3.0* 3.2* 2.9*  CL 83* 90* 91*  CO2 23 25 24   GLUCOSE 179* 143* 276*  BUN <5* 7 12  CREATININE 0.43* 0.44* 0.47*  CALCIUM  7.7* 8.2* 8.1*   LFT Recent Labs    04/05/24 1220 04/06/24 0710 04/07/24 0823  PROT 6.3* 5.8* 5.9*  ALBUMIN  2.8* 2.9* 2.6*  AST 216* 149* 131*  ALT 74* 57* 63*  ALKPHOS 569* 433* 414*  BILITOT 24.1* 24.1* 25.6*   PT/INR Recent Labs    04/05/24 0206 04/07/24 0823  INR 1.2 1.2   MELD 3.0: 25 at 04/07/2024  8:23 AM MELD-Na: 27 at 04/07/2024  8:23 AM Calculated from: Serum Creatinine: 0.47 mg/dL (Using min of 1 mg/dL) at 0/83/7974  1:76 AM Serum Sodium: 127 mmol/L at 04/07/2024  8:23 AM Total Bilirubin: 25.6 mg/dL at 0/83/7974  1:76 AM Serum Albumin : 2.6 g/dL at 0/83/7974  1:76 AM INR(ratio): 1.2 at 04/07/2024  8:23 AM Age at listing (hypothetical): 43 years Sex: Male at 04/07/2024  8:23  AM    Imaging/Other results: No results found.    Assessment and Plan:  43 year old male with history of uncontrolled diabetes and chronic alcohol use, admitted with alcoholic hepatitis, ascites and severe hyponatremia.  No significant hepatic encephalopathy.  Paracentesis negative for SBP.  EGD 9/15 with small esophageal varices and mild diffuse portal hypertensive gastropathy.  Alcoholic hepatitis with ascites, no HE - Discriminant function 41 yesterday when using Control PT of 12 - Started on prednisolone  40 mg on 9/15; plan to recheck MDF score at day 7 - LAEs improving, but tbili continues to increase;  Synthetic function remains intact - Patient's appetite good, no symptoms of nausea or anorexia. - We discussed the severity of his condition in depth today and how any alcohol consumption going forward will result in an early death  Ascites Well controlled following 3L paracentesis on 9/12 - On low dose diuretics, lasix  20 mg every day, aldactone  100 mg daily - No SBP - low salt diet  Esophageal varices - Small, low risk; banding not indicated - Recommend starting coreg 6.25 mg daily at discharge for primary prophylaxis  Severe hyponatremia - Secondary to beer potomania/poor nutrition - Improving, sodium 127 today - Preserved renal function  Uncontrolled diabetes - This likely accelerated his liver disease.  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   Glendia FORBES Holt, MD  04/07/2024, 4:33 PM  Gastroenterology

## 2024-04-07 NOTE — Progress Notes (Signed)
 [facility]   ROUNDING   NOTE Carols Clemence FMW:[id number]  DOB: December 24, 1980  DOA: 04/03/2024  PCP: Patient, [name]  04/07/2024,3:10 PM  LOS: 4 days    Code Status:  full     From:  unclear    43 year old homeless Spanish-speaking male Known DM TY 2 not taking meds for 6 months as cannot afford EtOH use habituation 2-3 beers per day-seen in ED 02/23/2024 N/V/persistent emesis Apparently not taking insulin -DDx at the time pancreatitis/gastritis/DKA-EtOH level was 455 lactic 3.6 Patient ambulatory and sent home with resolution of symptoms---   9/12 return [name], ED Abdominal distention nausea-difficulty eating with distention-full feeling-generalized fatigue difficulty walking--- tells me that for 2 weeks he has had fullness in his abdomen as wll as sensitivity in the stomach feeling very weak having dark tarry stool like coffee ground for the past 3 days  Bmet confirms sodium 117, CL 82 Ast/ALT 306/97 baseline about a month ago 256/178-alk phos 623 total bili 16.7 from No INR performed Anion gap is 13 Lipase 64 Ammonia 80 FOBT confirmed as positive  Imaging confirms cirrhosis without cholecystitis 9/13 large-volume paracentesis 3 L 9/15 endoscopy grade 1 esophageal varices not high risk, GAVE biopsied  Remains hospitalized secondary to   Assessment  & Plan :    Alcoholic hepatitis discriminant score 6 but variable if using different cut-off for PT, MELD score 24 GI suggesting prednisone 40X 4 weeks As per them Ascites without SBP based on ascitic tap Severe hypervolemic hyponatremia DDx tea toast potomania with volume overload Ascitic tap < 250 PMN-no need for antibiotics LVP as above -1.6 L-start Aldactone  100/Lasix  20--- standing weights daily ?  GI bleed Hemoccult positive on admission Endoscopy as above-Protonix  40 twice daily, propranolol  10 twice daily already ordered Lytes disturbances hypoKalemia-give K. Dur 40 3 times daily and recheck in a.m. Hypomagnesemia  --recheck a.m. 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 Sinus bradycardia now can go to telemetry DM TY 2 noncompliant secondary to inability to get meds Noncompliant X 6 months CBG 150-276 do not expect to have good control with steroids Sliding scale moderate 3 times a day with meals increase regular insulin  to 8 units 3 times daily meals continue Lantus  4 units Chronic homelessness TOC input listed today he will need significant help affording meds etc.   Data Reviewed:   Sodium 127 potassium 2.9 chloride 91 BUN/creatinine 12/0.4 AST/ALT 131/63 bilirubin 25.6 WBC 12.1 hemoglobin 12.9 platelet 244 INR 1.2  DVT prophylaxis: SCD  Status is: Inpatient Remains inpatient appropriate because:   Requires improvement in labs etc.     Current Dispo: Inpatient     Subjective:    Looks well sitting talking on phone No chest pain no fever to regular stools No bleeding  Objective + exam Vitals:   04/07/24 0422 04/07/24 0715 04/07/24 1107 04/07/24 1135  BP: 106/71 114/83 111/77 111/77  Pulse: 75  80 92  Resp: 20 16 15    Temp: 98.4 F (36.9 C) 98.4 F (36.9 C) 98 F (36.7 C)   TempSrc: Oral Oral Oral   SpO2: 98% 98% 96%   Weight:      Height:       Filed Weights   04/03/24 2115 04/06/24 0839  Weight: 77 kg 77 kg     Examination:  EOMI NCAT Remains quite icteric Chest is clear  no wheeze Abdomen distended no rebound No lower extremity edema  Scheduled Meds:  calcium  carbonate  1 tablet Oral BID WC   furosemide   20 mg Oral Daily   insulin  aspart  0-15 Units Subcutaneous TID WC   insulin  aspart  8 Units Subcutaneous TID WC   insulin  glargine  4 Units Subcutaneous QHS   lidocaine  (PF)  10 mL Intradermal Once   lidocaine -EPINEPHrine   10 mL Intradermal Once    mupirocin  ointment  1 Application Nasal BID   pantoprazole   40 mg Oral BID   potassium chloride   40 mEq Oral TID   prednisoLONE   40 mg Oral Daily   propranolol   10 mg Oral BID   spironolactone   100 mg Oral Daily   Continuous Infusions:  magnesium  sulfate bolus IVPB     Time 20  Jai-Gurmukh Corbet Hanley, MD  [facility]

## 2024-04-07 NOTE — Plan of Care (Signed)
  Problem: Coping: Goal: Ability to adjust to condition or change in health will improve Outcome: Progressing   Problem: Fluid Volume: Goal: Ability to maintain a balanced intake and output will improve Outcome: Progressing   Problem: Health Behavior/Discharge Planning: Goal: Ability to identify and utilize available resources and services will improve Outcome: Progressing Goal: Ability to manage health-related needs will improve Outcome: Progressing   Problem: Metabolic: Goal: Ability to maintain appropriate glucose levels will improve Outcome: Progressing   Problem: Education: Goal: Knowledge of General Education information will improve Description: Including pain rating scale, medication(s)/side effects and non-pharmacologic comfort measures Outcome: Progressing   Problem: Health Behavior/Discharge Planning: Goal: Ability to manage health-related needs will improve Outcome: Progressing

## 2024-04-07 NOTE — Plan of Care (Signed)

## 2024-04-07 NOTE — TOC Progression Note (Signed)
 Transition of Care [facility]) - Progression Note    Patient Details  Name: Tanner Cooper MRN: [id number] Date of Birth: 18-Sep-1980  [facility] of [facility]) CM/SW Contact  Lauraine FORBES Saa, LCSWA Phone Number: 04/07/2024, 11:20 AM  Clinical Narrative:     11:20 AM CSW returned to patient's bedside and asked (with medical interpreter Lonni [phone]) if they would like to have a contact listed. Patient declined CSW offer of listing point of contact. No TOC needs identified at this time. TOC will continue to follow and be available to assist.  Expected Discharge Plan: Homeless Shelter Barriers to Discharge: Continued Medical Work up               Expected Discharge Plan and Services In-house Referral: Clinical Social Work Discharge Planning Services: CM Consult   Living arrangements for the past 2 months: Homeless, Homeless Shelter                                       Social Drivers of Health (SDOH) Interventions SDOH Screenings   Food Insecurity: Food Insecurity Present (04/04/2024)  Housing: High Risk (04/04/2024)  Transportation Needs: Unmet Transportation Needs (04/04/2024)  Utilities: Patient Declined (04/04/2024)  Tobacco Use: Low Risk  (04/06/2024)    Readmission Risk Interventions     No data to display

## 2024-04-08 LAB — GLUCOSE, CAPILLARY
Glucose-Capillary: 253 mg/dL — ABNORMAL HIGH (ref 70–99)
Glucose-Capillary: 168 mg/dL — ABNORMAL HIGH (ref 70–99)
Glucose-Capillary: 197 mg/dL — ABNORMAL HIGH (ref 70–99)
Glucose-Capillary: 330 mg/dL — ABNORMAL HIGH (ref 70–99)

## 2024-04-08 LAB — CBC
HCT: 36.5 % — ABNORMAL LOW (ref 39.0–52.0)
Hemoglobin: 12.7 g/dL — ABNORMAL LOW (ref 13.0–17.0)
MCH: 35.3 pg — ABNORMAL HIGH (ref 26.0–34.0)
MCHC: 34.8 g/dL (ref 30.0–36.0)
MCV: 101.4 fL — ABNORMAL HIGH (ref 80.0–100.0)
Platelets: 240 K/uL (ref 150–400)
RBC: 3.6 MIL/uL — ABNORMAL LOW (ref 4.22–5.81)
RDW: 15.5 % (ref 11.5–15.5)
WBC: 11.2 K/uL — ABNORMAL HIGH (ref 4.0–10.5)
nRBC: 0 % (ref 0.0–0.2)

## 2024-04-08 LAB — COMPREHENSIVE METABOLIC PANEL WITH GFR
ALT: 69 U/L — ABNORMAL HIGH (ref 0–44)
AST: 131 U/L — ABNORMAL HIGH (ref 15–41)
Albumin: 2.5 g/dL — ABNORMAL LOW (ref 3.5–5.0)
Alkaline Phosphatase: 400 U/L — ABNORMAL HIGH (ref 38–126)
Anion gap: 15 (ref 5–15)
BUN: 14 mg/dL (ref 6–20)
CO2: 20 mmol/L — ABNORMAL LOW (ref 22–32)
Calcium: 8.4 mg/dL — ABNORMAL LOW (ref 8.9–10.3)
Chloride: 96 mmol/L — ABNORMAL LOW (ref 98–111)
Creatinine, Ser: 0.45 mg/dL — ABNORMAL LOW (ref 0.61–1.24)
GFR, Estimated: >60 mL/min (ref >60)
Glucose, Bld: 152 mg/dL — ABNORMAL HIGH (ref 70–99)
Potassium: 4.3 mmol/L (ref 3.5–5.1)
Sodium: 131 mmol/L — ABNORMAL LOW (ref 135–145)
Total Bilirubin: 24.1 mg/dL (ref 0.0–1.2)
Total Protein: 5.6 g/dL — ABNORMAL LOW (ref 6.5–8.1)

## 2024-04-08 LAB — PROTIME-INR
INR: 1.2 (ref 0.8–1.2)
Prothrombin Time: 15.7 s — ABNORMAL HIGH (ref 11.4–15.2)

## 2024-04-08 MED ORDER — INSULIN ASPART 100 UNIT/ML IJ SOLN
0.0000 [IU] | Freq: Every day | INTRAMUSCULAR | Status: DC
  Administered 2024-04-08: 4 [IU] via SUBCUTANEOUS
  Administered 2024-04-09 – 2024-04-10 (×2): 3 [IU] via SUBCUTANEOUS

## 2024-04-08 NOTE — Progress Notes (Signed)
 PROGRESS NOTE    Tanner Cooper  FMW:[id number] DOB: 1981/01/09 DOA: 04/03/2024 PCP: Patient, [name]   Brief Narrative:   43 year old homeless Spanish-speaking male Known DM TY 2 not taking meds for 6 months as cannot afford EtOH use habituation 2-3 beers per day-seen in ED 02/23/2024 N/V/persistent emesis. He is being treated for acute alcoholic hepatitis with IV steroids and GI is on board. He has issues with insurance, lost his job and has no place to live.   Assessment & Plan:  Principal Problem:   Alcoholic hepatitis Active Problems:   Acute liver failure   ETOH abuse   Hyponatremia   Metabolic dysfunction-associated steatohepatitis (MASH)   Secondary esophageal varices without bleeding (HCC)   Portal hypertensive gastropathy (HCC)    Acute Alcoholic hepatitis,POA: No hepatic encephalopathy - Continue with steroids as MDF is high (>32) -GI On board. -Improving liver enzymes -f/u LFTs daily  Ascites without SBP based on ascitic tap, s/p 3 L fluid removal on 9/12 Severe hypervolemic hyponatremia DDx tea toast potomania with volume overload -Ascitic tap < 250 PMN-no need for antibiotics -  ? GI bleed, Hemoccult positive on admission -Endoscopy as above-Protonix  40 twice daily, propranolol  10 twice daily already ordered He does have esophageal varices (small, low risk)   Risk of  alcohol withdrawal -Discontinued Ativan  protocol as not withdrawing on 9/15  Hyponatremia,POA: Likely a combination of beer potomania and alcoholic hepatitis with ascites. F/u BMP daily Na 131 today   DM Type 2 noncompliant secondary to inability to get meds -Noncompliant X 6 months secondary to financial issues. He has no insurance as he lost his job. -Sliding scale moderate 3 times a day with meals increase regular insulin  to 8 units 3 times daily meals continue Lantus  4 units  Chronic homelessness -TOC on board  Disposition: He is homeless. TOC on board.  DVT prophylaxis:  SCDs Start: 04/03/24 1713     Code Status: Full Code Family Communication:  None at the bedside Status is: Inpatient   Subjective:  Denies any active complaints. He spoke to me about his loss of job, financial issues with inability to afford medications and homelessness. He spoke to case management about that as well.   Examination:  General exam: Appears calm and comfortable . Generalized yellow skin, jaundiced Respiratory system: Clear to auscultation. Respiratory effort normal. Cardiovascular system: S1 & S2 heard, RRR. No JVD, murmurs, rubs, gallops or clicks. No pedal edema. Gastrointestinal system: Abdomen is nondistended, soft and nontender. No organomegaly or masses felt. Normal bowel sounds heard. Central nervous system: Alert and oriented. No focal neurological deficits. Extremities: Symmetric 5 x 5 power. Skin: No rashes, lesions or ulcers Psychiatry: Judgement and insight appear normal. Mood & affect appropriate.       Diet Orders (From admission, onward)     Start     Ordered   04/06/24 1222  Diet 2 gram sodium Room service appropriate? Yes; Fluid consistency: Thin; Fluid restriction: 1200 mL Fluid  Diet effective now       Question Answer Comment  Room service appropriate? Yes   Fluid consistency: Thin   Fluid restriction: 1200 mL Fluid      04/06/24 1222            Objective: Vitals:   04/08/24 0009 04/08/24 0451 04/08/24 0814 04/08/24 0907  BP: 96/67 91/66 101/68 101/68  Pulse: (!) 51 (!) 50 (!) 53 (!) 53  Resp: 14 12 18    Temp: 98.4 F (36.9 C) 98.2  F (36.8 C) (!) 97.4 F (36.3 C)   TempSrc: Oral Oral    SpO2: 99% 100% 97%   Weight:      Height:       No intake or output data in the 24 hours ending 04/08/24 0933 Filed Weights   04/03/24 2115 04/06/24 0839  Weight: 77 kg 77 kg    Scheduled Meds:  calcium  carbonate  1 tablet Oral BID WC   furosemide   20 mg Oral Daily   insulin  aspart  0-15 Units Subcutaneous TID WC   insulin  aspart   8 Units Subcutaneous TID WC   insulin  glargine  4 Units Subcutaneous QHS   lidocaine  (PF)  10 mL Intradermal Once   lidocaine -EPINEPHrine   10 mL Intradermal Once   mupirocin  ointment  1 Application Nasal BID   pantoprazole   40 mg Oral BID   potassium chloride   40 mEq Oral TID   prednisoLONE   40 mg Oral Daily   propranolol   10 mg Oral BID   spironolactone   100 mg Oral Daily   Continuous Infusions:  magnesium  sulfate bolus IVPB      Nutritional status     Body mass index is 29.14 kg/m.  Data Reviewed:   CBC: Recent Labs  Lab 04/03/24 1400 04/03/24 1457 04/04/24 0551 04/05/24 0206 04/07/24 0823 04/08/24 0421  WBC 10.3  --  10.7* 12.2* 12.1* 11.2*  NEUTROABS 8.6*  --   --  9.1* 9.4*  --   HGB 13.6 16.0 13.1 12.0* 12.9* 12.7*  HCT 38.7* 47.0 36.0* 33.1* 36.7* 36.5*  MCV 96.5  --  95.7 96.2 99.7 101.4*  PLT 182  --  184 173 244 240   Basic Metabolic Panel: Recent Labs  Lab 04/04/24 1628 04/04/24 2307 04/05/24 0206 04/05/24 1220 04/06/24 0710 04/07/24 0823 04/08/24 0421  NA 119*   < > 124* 119* 126* 127* 131*  K 2.7*   < > 3.3* 3.0* 3.2* 2.9* 4.3  CL 83*   < > 88* 83* 90* 91* 96*  CO2 24   < > 25 23 25 24  20*  GLUCOSE 197*   < > 94 179* 143* 276* 152*  BUN <5*   < > <5* <5* 7 12 14   CREATININE 0.39*   < > 0.38* 0.43* 0.44* 0.47* 0.45*  CALCIUM  7.3*   < > 7.8* 7.7* 8.2* 8.1* 8.4*  MG 1.6*  --   --  1.6*  --   --   --    < > = values in this interval not displayed.   GFR: Estimated Creatinine Clearance: 114 mL/min (A) (by C-G formula based on SCr of 0.45 mg/dL (L)). Liver Function Tests: Recent Labs  Lab 04/05/24 0206 04/05/24 1220 04/06/24 0710 04/07/24 0823 04/08/24 0421  AST 213* 216* 149* 131* 131*  ALT 69* 74* 57* 63* 69*  ALKPHOS 507* 569* 433* 414* 400*  BILITOT 20.2* 24.1* 24.1* 25.6* 24.1*  PROT 6.1* 6.3* 5.8* 5.9* 5.6*  ALBUMIN  2.9* 2.8* 2.9* 2.6* 2.5*   Recent Labs  Lab 04/03/24 1400  LIPASE 64*   Recent Labs  Lab 04/03/24 1633   AMMONIA 80*   Coagulation Profile: Recent Labs  Lab 04/03/24 1741 04/04/24 0551 04/05/24 0206 04/07/24 0823 04/08/24 0421  INR 1.0 1.0 1.2 1.2 1.2   Cardiac Enzymes: No results for input(s): CKTOTAL, CKMB, CKMBINDEX, TROPONINI in the last 168 hours. BNP (last 3 results) No results for input(s): PROBNP in the last 8760 hours. HbA1C: No results for input(s): HGBA1C  in the last 72 hours. CBG: Recent Labs  Lab 04/07/24 0602 04/07/24 0833 04/07/24 1131 04/07/24 1626 04/08/24 0813  GLUCAP 220* 276* 150* 244* 253*   Lipid Profile: No results for input(s): CHOL, HDL, LDLCALC, TRIG, CHOLHDL, LDLDIRECT in the last 72 hours. Thyroid Function Tests: No results for input(s): TSH, T4TOTAL, FREET4, T3FREE, THYROIDAB in the last 72 hours. Anemia Panel: No results for input(s): VITAMINB12, FOLATE, FERRITIN, TIBC, IRON, RETICCTPCT in the last 72 hours. Sepsis Labs: No results for input(s): PROCALCITON, LATICACIDVEN in the last 168 hours.  Recent Results (from the past 240 hours)  Body fluid culture w Gram Stain     Status: None   Collection Time: 04/03/24  7:35 PM   Specimen: Peritoneal Cavity; Peritoneal Fluid  Result Value Ref Range Status   Specimen Description PERITONEAL CAVITY  Final   Special Requests NONE  Final   Gram Stain NO WBC SEEN NO ORGANISMS SEEN   Final   Culture   Final    NO GROWTH 3 DAYS Performed at [facility], [address] [place], [place] [address]    Report Status 04/07/2024 FINAL  Final  MRSA Next Gen by PCR, Nasal     Status: Abnormal   Collection Time: 04/03/24  9:13 PM   Specimen: Nasal Swab  Result Value Ref Range Status   MRSA by PCR Next Gen DETECTED (A) NOT DETECTED Final    Comment: RESULT CALLED TO, READ BACK BY AND VERIFIED WITH: RN M. ZERSONO [id number] FCP (NOTE) The GeneXpert MRSA Assay (FDA approved for NASAL specimens only), is one component of a comprehensive MRSA  colonization surveillance program. It is not intended to diagnose MRSA infection nor to guide or monitor treatment for MRSA infections. Test performance is not FDA approved in patients less than 59 years old. Performed at [facility], [address] [place], [place] [address]          Radiology Studies: No results found.      LOS: 5 days   Time spent= 40 mins    Deliliah Room, MD [facility]  If 7PM-7AM, please contact night-coverage  04/08/2024, 9:33 AM

## 2024-04-08 NOTE — Plan of Care (Signed)

## 2024-04-08 NOTE — Progress Notes (Signed)
 [facility] ROUNDING NOTE   Subjective: Patient feeling well this morning.  Slept well.  Has increased energy.  Eating well.  Hyponatremia improving rapidly.  Tbili hopefully has peaked.   Objective: Vital signs in last 24 hours: Temp:  [97.4 F (36.3 C)-98.9 F (37.2 C)] 97.4 F (36.3 C) (09/17 0814) Pulse Rate:  [50-92] 53 (09/17 0907) Resp:  [12-18] 18 (09/17 0814) BP: (91-124)/(66-96) 101/68 (09/17 0907) SpO2:  [97 %-100 %] 97 % (09/17 0814) Last BM Date : 04/07/24 General: NAD, pleasant Hispanic male, deeply jaundiced Lungs:  CTA b/l, no w/r/r Heart:  RRR, no m/r/g Abdomen:  Soft, NT, ND, +BS Ext:  No c/c/e    Intake/Output from previous day: 09/16 0701 - 09/17 0700 In: 240 [P.O.:240] Out: -  Intake/Output this shift: No intake/output data recorded.   Lab Results: Recent Labs    04/07/24 0823 04/08/24 0421  WBC 12.1* 11.2*  HGB 12.9* 12.7*  PLT 244 240  MCV 99.7 101.4*   BMET Recent Labs    04/06/24 0710 04/07/24 0823 04/08/24 0421  NA 126* 127* 131*  K 3.2* 2.9* 4.3  CL 90* 91* 96*  CO2 25 24 20*  GLUCOSE 143* 276* 152*  BUN 7 12 14   CREATININE 0.44* 0.47* 0.45*  CALCIUM  8.2* 8.1* 8.4*   LFT Recent Labs    04/06/24 0710 04/07/24 0823 04/08/24 0421  PROT 5.8* 5.9* 5.6*  ALBUMIN  2.9* 2.6* 2.5*  AST 149* 131* 131*  ALT 57* 63* 69*  ALKPHOS 433* 414* 400*  BILITOT 24.1* 25.6* 24.1*   PT/INR Recent Labs    04/07/24 0823 04/08/24 0421  INR 1.2 1.2      Imaging/Other results: No results found.    Assessment and Plan:  43 year old male with history of uncontrolled diabetes and chronic alcohol use, admitted with alcoholic hepatitis, ascites and severe hyponatremia.  No significant hepatic encephalopathy.  Paracentesis negative for SBP.  EGD 9/15 with small esophageal varices and mild diffuse portal hypertensive gastropathy.   Alcoholic hepatitis with ascites, no HE - Started on prednisolone  40 mg on 9/15; plan to  continue for 4 weeks total - LAEs improving, tbili hopefully has peaked today  Synthetic function remains intact - Preserved synthetic function - Patient's appetite good, no symptoms of nausea or anorexia. - Alcohol abstinence critical to avoid early death - High likelihood of underlying cirrhosis; will be better assessed once healed from alcoholic hepatitis   Ascites Well controlled following 3L paracentesis on 9/12 - On low dose diuretics, lasix  20 mg every day, aldactone  100 mg daily - No SBP - low salt diet - Continue current regimen   Esophageal varices - Small, low risk; banding not indicated - Recommend starting coreg 6.25 mg daily at discharge for primary prophylaxis   Severe hyponatremia - Secondary to beer potomania/poor nutrition - Improving with nutrition/PO intake - Preserved renal function   Uncontrolled diabetes - This likely accelerated his liver disease.  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  Hopefully SW will be able to assist.  As patient seems to be improving, GI will sign off at this time.  Will arrange outpatient follow up.  Please reconsult for further questions      Tanner FORBES Holt, MD  04/08/2024, 11:26 AM [facility]

## 2024-04-09 ENCOUNTER — Other Ambulatory Visit (HOSPITAL_COMMUNITY): Payer: Self-pay

## 2024-04-09 LAB — CBC
HCT: 37.4 % — ABNORMAL LOW (ref 39.0–52.0)
Hemoglobin: 12.7 g/dL — ABNORMAL LOW (ref 13.0–17.0)
MCH: 35.1 pg — ABNORMAL HIGH (ref 26.0–34.0)
MCHC: 34 g/dL (ref 30.0–36.0)
MCV: 103.3 fL — ABNORMAL HIGH (ref 80.0–100.0)
Platelets: 260 K/uL (ref 150–400)
RBC: 3.62 MIL/uL — ABNORMAL LOW (ref 4.22–5.81)
RDW: 15.7 % — ABNORMAL HIGH (ref 11.5–15.5)
WBC: 11.3 K/uL — ABNORMAL HIGH (ref 4.0–10.5)
nRBC: 0 % (ref 0.0–0.2)

## 2024-04-09 LAB — COMPREHENSIVE METABOLIC PANEL WITH GFR
ALT: 75 U/L — ABNORMAL HIGH (ref 0–44)
AST: 107 U/L — ABNORMAL HIGH (ref 15–41)
Albumin: 2.6 g/dL — ABNORMAL LOW (ref 3.5–5.0)
Alkaline Phosphatase: 407 U/L — ABNORMAL HIGH (ref 38–126)
Anion gap: 10 (ref 5–15)
BUN: 11 mg/dL (ref 6–20)
CO2: 25 mmol/L (ref 22–32)
Calcium: 8.5 mg/dL — ABNORMAL LOW (ref 8.9–10.3)
Chloride: 96 mmol/L — ABNORMAL LOW (ref 98–111)
Creatinine, Ser: 0.55 mg/dL — ABNORMAL LOW (ref 0.61–1.24)
GFR, Estimated: >60 mL/min (ref >60)
Glucose, Bld: 250 mg/dL — ABNORMAL HIGH (ref 70–99)
Potassium: 3.7 mmol/L (ref 3.5–5.1)
Sodium: 131 mmol/L — ABNORMAL LOW (ref 135–145)
Total Bilirubin: 21.6 mg/dL (ref 0.0–1.2)
Total Protein: 5.9 g/dL — ABNORMAL LOW (ref 6.5–8.1)

## 2024-04-09 LAB — GLUCOSE, CAPILLARY
Glucose-Capillary: 210 mg/dL — ABNORMAL HIGH (ref 70–99)
Glucose-Capillary: 181 mg/dL — ABNORMAL HIGH (ref 70–99)
Glucose-Capillary: 304 mg/dL — ABNORMAL HIGH (ref 70–99)
Glucose-Capillary: 293 mg/dL — ABNORMAL HIGH (ref 70–99)

## 2024-04-09 LAB — PROTIME-INR
INR: 1.2 (ref 0.8–1.2)
Prothrombin Time: 15.6 s — ABNORMAL HIGH (ref 11.4–15.2)

## 2024-04-09 MED ORDER — INSULIN GLARGINE 100 UNIT/ML ~~LOC~~ SOLN
10.0000 [IU] | Freq: Every day | SUBCUTANEOUS | Status: DC
  Administered 2024-04-09 – 2024-04-10 (×2): 10 [IU] via SUBCUTANEOUS
  Filled 2024-04-09 (×3): qty 0.1

## 2024-04-09 MED ORDER — INSULIN STARTER KIT- PEN NEEDLES (SPANISH)
1.0000 | Freq: Once | Status: AC
  Administered 2024-04-09: 1
  Filled 2024-04-09: qty 1

## 2024-04-09 NOTE — Progress Notes (Signed)
  MATCH MEDICATION ASSISTANCE CARD Pharmacies please call [phone] for claim processing assistance.  Rx BIN: [id number] Rx Group: [id number] Rx PCN: [id number] Relationship Code: 1 Person Code: 01  Patient ID (MRN): [id number]    Patient Name: Tanner Cooper   Patient DOB Jul 10, 1981   Discharge Date:04/10/2024  Expiration Date:04/16/2024 (must be filled within 7 days of discharge)   Dear Rosine have been approved to have the prescriptions written by your discharging physician filled through our [facility] (Medication Assistance Through [facility]) program. This program allows for a one-time (no refills) 34-day supply of selected medications for a low copay amount.  The copay is $3.00 per prescription. For instance, if you have one prescription, you will pay $3.00; for two prescriptions, you pay $6.00; for three prescriptions, you pay $9.00; and so on. Only certain pharmacies are participating in this program with [facility]. You will need to select one of the pharmacies from the attached lists and take your prescriptions, this letter, and your photo ID to one of the participating pharmacies.  We are excited that you are able to use the [facility] program to get your medications. These prescriptions must be filled within 7 days of hospital discharge or they will no longer be valid for the [facility] program. Should you have any problems with your prescriptions please contact your case management team member at [phone] for [place] or [phone] for [facility].  Thank you, [facility]    [facility] Program Pharmacies [place]. [facility], [facility], [facility]  [facility]acies [address] [place], [place] [address] [place], [place] [address] [place], [employer] [address], [place] Other [facility] [address] [place], [place] [address]  [facility] [id number], [place]

## 2024-04-09 NOTE — Plan of Care (Signed)

## 2024-04-09 NOTE — TOC Progression Note (Signed)
 Transition of Care [facility] - Progression Note    Patient Details  Name: Tanner Cooper MRN: [id number] Date of Birth: 1980/10/24  Transition of Care [facility]) CM/SW Contact  Roxie KANDICE Stain, RN Phone Number: 04/09/2024, 1:08 PM  Clinical Narrative:     New PCP apt on AVS. MATCH letter sent to [facility] pharmacy.  ICM (Inpatient Care Management) will continue to follow.  Expected Discharge Plan: Homeless Shelter Barriers to Discharge: Continued Medical Work up               Expected Discharge Plan and Services In-house Referral: Clinical Social Work Discharge Planning Services: CM Consult   Living arrangements for the past 2 months: Homeless, Homeless Shelter                                       Social Drivers of Health (SDOH) Interventions SDOH Screenings   Food Insecurity: Food Insecurity Present (04/04/2024)  Housing: High Risk (04/04/2024)  Transportation Needs: Unmet Transportation Needs (04/04/2024)  Utilities: Patient Declined (04/04/2024)  Tobacco Use: Low Risk  (04/06/2024)    Readmission Risk Interventions     No data to display

## 2024-04-09 NOTE — Progress Notes (Signed)
 PROGRESS NOTE    Tanner Cooper  FMW:[id number] DOB: Jun 20, 1981 DOA: 04/03/2024 PCP: Patient, [name]  Chief Complaint  Patient presents with   Abdominal Pain    Brief Narrative:   43 year old homeless Spanish-speaking male Known DM TY 2 not taking meds for 6 months as cannot afford EtOH use habituation 2-3 beers per day-seen in ED 02/23/2024 N/V/persistent emesis. He is being treated for acute alcoholic hepatitis with IV steroids and GI is on board. He has issues with insurance, lost his job and has no place to live.  Assessment & Plan:   Principal Problem:   Alcoholic hepatitis Active Problems:   Acute liver failure   ETOH abuse   Hyponatremia   Metabolic dysfunction-associated steatohepatitis (MASH)   Secondary esophageal varices without bleeding (HCC)   Portal hypertensive gastropathy (HCC)  Homelessness Complicates care and follow up plan.  He doesn't currently have Tanner Cooper working phone.  He needs follow up appointments made before he discharges.  Will try to simplify meds where possible.     Acute Alcoholic hepatitis No encephalopathy Will discontinue steroids given his hyperglycemia and complexity of discharging him on steroids + insulin  with homelessness (discussed with GI)   Labs will need to be followed outpatient  Ascites  SBP ruled out s/p 3 L fluid removal on 9/12  ? 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  Without steroid induced hyperglycemia, I think he'll be able to do well without insulin  on oral meds.       Chronic homelessness -TOC on board    DVT prophylaxis: SCD Code Status: full Family Communication: none Disposition:   Status is: Inpatient Remains inpatient appropriate because: need for continued inpatient care   Consultants:  GI IR   Procedures:  IMPRESSION: Successful ultrasound-guided paracentesis yielding 3.1 liters of peritoneal fluid.  EGD Impression: - The examined portions of the nasopharynx, oropharynx and larynx were normal. - Grade I esophageal varices. Not high risk. No suspicion as bleeding source. - Z- line irregular. - Portal hypertensive gastropathy. Biopsied. - Normal examined duodenum. Moderate Sedation:  N/ TRUE Tanner Cooper Recommendation: - Return patient to hospital ward for ongoing care. - Resume previous diet. - Continue present medications. - Await pathology results. - Recommend starting carvedilol 6. 25 mg once daily for variceal bleeding prophylaxis. This can be started at discharge. - Consider starting prednisolone  for alcoholic hepatitis. Although his DF is not > 32 when the control PT time is 15, it is > 32 when Tanner Cooper control PT time of 12 is used ( our lab quotes Tanner Cooper normal prothrombin range of 11. 4- 15. 2)  Antimicrobials:  Anti-infectives (From admission, onward)    None       Subjective: No complaints  Objective: Vitals:   04/09/24 0815 04/09/24 0840 04/09/24 1138 04/09/24 1649  BP: 108/85 111/86 107/84 116/85  Pulse: 86 99 86 (!) 58  Resp: 18  18 18   Temp: 99.3 F (37.4 C)     TempSrc: Oral     SpO2: 100% 98% 98% 100%  Weight:      Height:       No intake or output  data in the 24 hours ending 04/09/24 1757 Filed Weights   04/03/24 2115 04/06/24 0839  Weight: 77 kg 77 kg    Examination:  General exam: Appears calm and comfortable  Jaundiced diffusely  Respiratory system: Clear to auscultation. Respiratory effort normal. Cardiovascular system: RRR Gastrointestinal system: mild ascites Central nervous system: Alert and oriented. No focal  neurological deficits.  No asterixis.  Extremities: no LEE   Data Reviewed: I have personally reviewed following labs and imaging studies  CBC: Recent Labs  Lab 04/03/24 1400 04/03/24 1457 04/04/24 0551 04/05/24 0206 04/07/24 0823 04/08/24 0421 04/09/24 0220  WBC 10.3  --  10.7* 12.2* 12.1* 11.2* 11.3*  NEUTROABS 8.6*  --   --  9.1* 9.4*  --   --   HGB 13.6   < > 13.1 12.0* 12.9* 12.7* 12.7*  HCT 38.7*   < > 36.0* 33.1* 36.7* 36.5* 37.4*  MCV 96.5  --  95.7 96.2 99.7 101.4* 103.3*  PLT 182  --  184 173 244 240 260   < > = values in this interval not displayed.    Basic Metabolic Panel: Recent Labs  Lab 04/04/24 1628 04/04/24 2307 04/05/24 1220 04/06/24 0710 04/07/24 0823 04/08/24 0421 04/09/24 0220  NA 119*   < > 119* 126* 127* 131* 131*  K 2.7*   < > 3.0* 3.2* 2.9* 4.3 3.7  CL 83*   < > 83* 90* 91* 96* 96*  CO2 24   < > 23 25 24  20* 25  GLUCOSE 197*   < > 179* 143* 276* 152* 250*  BUN <5*   < > <5* 7 12 14 11   CREATININE 0.39*   < > 0.43* 0.44* 0.47* 0.45* 0.55*  CALCIUM  7.3*   < > 7.7* 8.2* 8.1* 8.4* 8.5*  MG 1.6*  --  1.6*  --   --   --   --    < > = values in this interval not displayed.    GFR: Estimated Creatinine Clearance: 114 mL/min (Tanner Cooper) (by C-G formula based on SCr of 0.55 mg/dL (L)).  Liver Function Tests: Recent Labs  Lab 04/05/24 1220 04/06/24 0710 04/07/24 0823 04/08/24 0421 04/09/24 0220  AST 216* 149* 131* 131* 107*  ALT 74* 57* 63* 69* 75*  ALKPHOS 569* 433* 414* 400* 407*  BILITOT 24.1* 24.1* 25.6* 24.1* 21.6*  PROT 6.3* 5.8* 5.9* 5.6* 5.9*  ALBUMIN  2.8* 2.9* 2.6* 2.5* 2.6*    CBG: Recent Labs  Lab 04/08/24 1600 04/08/24 2039 04/09/24 0815 04/09/24 1137 04/09/24 1648  GLUCAP 197* 330* 210* 181* 304*     Recent Results (from the past 240 hours)  Body fluid culture w Gram Stain     Status: None   Collection Time: 04/03/24  7:35 PM   Specimen: Peritoneal Cavity; Peritoneal Fluid  Result Value Ref Range Status   Specimen  Description PERITONEAL CAVITY  Final   Special Requests NONE  Final   Gram Stain NO WBC SEEN NO ORGANISMS SEEN   Final   Culture   Final    NO GROWTH 3 DAYS Performed at [facility], [address], [place], [place] [address]    Report Status 04/07/2024 FINAL  Final  MRSA Next Gen by PCR, Nasal     Status: Abnormal   Collection Time: 04/03/24  9:13 PM   Specimen: Nasal Swab  Result Value Ref Range Status   MRSA by PCR Next Gen DETECTED (Tanner Cooper) NOT DETECTED Final    Comment:  RESULT CALLED TO, READ BACK BY AND VERIFIED WITH: RN M. ZERSONO [phone] FCP (NOTE) The GeneXpert MRSA Assay (FDA approved for NASAL specimens only), is one component of Tanner Cooper comprehensive MRSA colonization surveillance program. It is not intended to diagnose MRSA infection nor to guide or monitor treatment for MRSA infections. Test performance is not FDA approved in patients less than 13 years old. Performed at [facility], [address] [place], [place] [address]          Radiology Studies: No results found.      Scheduled Meds:  calcium  carbonate  1 tablet Oral BID WC   furosemide   20 mg Oral Daily   insulin  aspart  0-15 Units Subcutaneous TID WC   insulin  aspart  0-5 Units Subcutaneous QHS   insulin  aspart  8 Units Subcutaneous TID WC   insulin  glargine  10 Units Subcutaneous QHS   lidocaine  (PF)  10 mL Intradermal Once   lidocaine -EPINEPHrine   10 mL Intradermal Once   mupirocin  ointment  1 Application Nasal BID   pantoprazole   40 mg Oral BID   prednisoLONE   40 mg Oral Daily   propranolol   10 mg Oral BID   spironolactone   100 mg Oral Daily   Continuous Infusions:  magnesium  sulfate bolus IVPB       LOS: 6 days    Time spent: over 30 min     Tanner Monte, MD [facility]   To contact the attending provider between 7A-7P or the covering provider during after hours 7P-7A, please log into the web site [email or web address] and access using universal [place]  password for that web site. If you do not have the password, please call the hospital operator.  04/09/2024, 5:57 PM

## 2024-04-09 NOTE — Progress Notes (Signed)
 The biopsies from the patient's upper GI Endoscopy were notable for H. Pylori gastritis.   Will plan to treat with quadruple therapy as outlined below   1) Omeprazole 20 mg 2 times a day x 14 d 2) Pepto Bismol 2 tabs (262 mg each) 4 times a day x 14 d 3) Metronidazole 250 mg 4 times a day x 14 d 4) doxycycline 100 mg 2 times a day x 14 d  Please include these medications in his discharge medication list  4 weeks after treatment completed, check H. Pylori stool antigen to confirm eradication (must be off acid suppression therapy for 2 weeks prior to specimen submission)  We will arrange outpatient follow up and assessment of H. Pylori eradication.   Bethann Qualley E. Stacia, MD [facility]

## 2024-04-10 ENCOUNTER — Other Ambulatory Visit (HOSPITAL_COMMUNITY): Payer: Self-pay

## 2024-04-10 DIAGNOSIS — K7200 Acute and subacute hepatic failure without coma: Secondary | ICD-10-CM

## 2024-04-10 LAB — COMPREHENSIVE METABOLIC PANEL WITH GFR
ALT: 81 U/L — ABNORMAL HIGH (ref 0–44)
AST: 91 U/L — ABNORMAL HIGH (ref 15–41)
Albumin: 2.5 g/dL — ABNORMAL LOW (ref 3.5–5.0)
Alkaline Phosphatase: 358 U/L — ABNORMAL HIGH (ref 38–126)
Anion gap: 9 (ref 5–15)
BUN: 12 mg/dL (ref 6–20)
CO2: 27 mmol/L (ref 22–32)
Calcium: 8.7 mg/dL — ABNORMAL LOW (ref 8.9–10.3)
Chloride: 98 mmol/L (ref 98–111)
Creatinine, Ser: 0.68 mg/dL (ref 0.61–1.24)
GFR, Estimated: >60 mL/min (ref >60)
Glucose, Bld: 139 mg/dL — ABNORMAL HIGH (ref 70–99)
Potassium: 3.7 mmol/L (ref 3.5–5.1)
Sodium: 134 mmol/L — ABNORMAL LOW (ref 135–145)
Total Bilirubin: 15.9 mg/dL — ABNORMAL HIGH (ref 0.0–1.2)
Total Protein: 5.9 g/dL — ABNORMAL LOW (ref 6.5–8.1)

## 2024-04-10 LAB — CBC
HCT: 37.3 % — ABNORMAL LOW (ref 39.0–52.0)
Hemoglobin: 12.7 g/dL — ABNORMAL LOW (ref 13.0–17.0)
MCH: 35.1 pg — ABNORMAL HIGH (ref 26.0–34.0)
MCHC: 34 g/dL (ref 30.0–36.0)
MCV: 103 fL — ABNORMAL HIGH (ref 80.0–100.0)
Platelets: 254 K/uL (ref 150–400)
RBC: 3.62 MIL/uL — ABNORMAL LOW (ref 4.22–5.81)
RDW: 15.6 % — ABNORMAL HIGH (ref 11.5–15.5)
WBC: 10.3 K/uL (ref 4.0–10.5)
nRBC: 0.3 % — ABNORMAL HIGH (ref 0.0–0.2)

## 2024-04-10 LAB — GLUCOSE, CAPILLARY
Glucose-Capillary: 163 mg/dL — ABNORMAL HIGH (ref 70–99)
Glucose-Capillary: 310 mg/dL — ABNORMAL HIGH (ref 70–99)
Glucose-Capillary: 157 mg/dL — ABNORMAL HIGH (ref 70–99)
Glucose-Capillary: 247 mg/dL — ABNORMAL HIGH (ref 70–99)
Glucose-Capillary: 252 mg/dL — ABNORMAL HIGH (ref 70–99)

## 2024-04-10 NOTE — TOC Progression Note (Signed)
 Transition of Care [facility]) - Progression Note    Patient Details  Name: Tanner Cooper MRN: [id number] Date of Birth: 1980-10-31  Transition of [facility]) CM/SW Contact  Sherline Clack, [place] Phone Number: 04/10/2024, 4:14 PM  Clinical Narrative:     CSW spoke with patient yesterday (9/19) regarding consult on the board. Patient was provided with a shelter list, and patient said he would reach out. CSW will continue to follow throughout patient's hospital stay.    Expected Discharge Plan: Homeless Shelter Barriers to Discharge: Continued Medical Work up               Expected Discharge Plan and Services In-house Referral: Clinical Social Work Discharge Planning Services: CM Consult   Living arrangements for the past 2 months: Homeless, Homeless Shelter                                       Social Drivers of Health (SDOH) Interventions SDOH Screenings   Food Insecurity: Food Insecurity Present (04/04/2024)  Housing: High Risk (04/04/2024)  Transportation Needs: Unmet Transportation Needs (04/04/2024)  Utilities: Patient Declined (04/04/2024)  Tobacco Use: Low Risk  (04/06/2024)    Readmission Risk Interventions     No data to display

## 2024-04-10 NOTE — Plan of Care (Signed)

## 2024-04-10 NOTE — Progress Notes (Addendum)
 [facility]  PROGRESS NOTE  Natanel Snavely FMW:[id number] DOB: 03-06-1981 DOA: 04/03/2024 PCP: Patient, [name]   Brief HPI:   43 year old homeless Spanish-speaking male Known DM TY 2 not taking meds for 6 months as cannot afford EtOH use habituation 2-3 beers per day-seen in ED 02/23/2024 N/V/persistent emesis. He is being treated for acute alcoholic hepatitis with IV steroids and GI is on board. He has issues with insurance, lost his job and has no place to live.    Assessment/Plan:   Acute alcoholic hepatitis Seen by GI, started on prednisolone  on 9/15 for total of 28 days -Will not be able to discharge on prednisolone  along with insulin  as patient is homeless - Patient is currently on prednisolone , called and discussed with GI Dr. Stacia.  He agrees regarding stopping prednisolone , considering that patient will need insulin  as outpatient.  Which she will not build to get due to homeless issues -Will discontinue prednisolone  today - Total bilirubin is down to 15.9, improved from 21.6 yesterday - Follow LFTs in a.m.  Hyperglycemia/diabetes mellitus type 2 - Likely in setting of steroid use above as above -Hemoglobin A1c 7.3 - Patient is on glargine 10 units subcu nightly along with insulin  aspart 8 units 3 times daily - Still has increased blood sugar - Will have to monitor in the hospital off steroids for stability of blood glucose before discharge - Patient is homeless, cannot get insulin  as outpatient as he will be living in shelter  Ascites/?  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 - Will discharge with quadruple therapy -1) Omeprazole 20 mg 2 times a day x 14 d 2) Pepto Bismol 2 tabs (262  mg each) 4 times a day x 14 d 3) Metronidazole 250 mg 4 times a day x 14 d 4) doxycycline 100 mg 2 times a day x 14 d   Hypervolemic hyponatremia -Likely from volume overload, improved  Homelessness - TOC consulted - Patient does not have a stable place to go - Likely will need to be on street and sleep in shelter - He will need follow-up appointment with GI and also need medications as above - Will have to monitor him without insulin  to make sure his blood glucose is stable for discharge   Medications     calcium  carbonate  1 tablet Oral BID WC   furosemide   20 mg Oral Daily   insulin  aspart  0-15 Units Subcutaneous TID WC   insulin  aspart  0-5 Units Subcutaneous QHS   insulin  aspart  8 Units Subcutaneous TID WC   insulin  glargine  10 Units Subcutaneous QHS   lidocaine  (PF)  10 mL Intradermal Once   lidocaine -EPINEPHrine   10 mL Intradermal Once   mupirocin  ointment  1 Application Nasal BID   pantoprazole   40 mg Oral BID   prednisoLONE   40 mg Oral Daily   propranolol   10 mg Oral BID   spironolactone   100 mg Oral Daily     Data Reviewed:   CBG:  Recent Labs  Lab 04/09/24 2132 04/10/24 0641 04/10/24 0814 04/10/24 1216 04/10/24 1555  GLUCAP 293* 163* 310* 157* 247*    SpO2: 100 % O2 Flow Rate (L/min): 0 L/min    Vitals:   04/10/24 0623 04/10/24  9183 04/10/24 0824 04/10/24 1556  BP: 112/77 97/63  109/84  Pulse: (!) 53 (!) 57 60 (!) 56  Resp: 18 19  19   Temp: 98.8 F (37.1 C) 99 F (37.2 C)  98.2 F (36.8 C)  TempSrc: Oral Oral  Oral  SpO2: 100% 100%  100%  Weight:      Height:          Data Reviewed:  Basic Metabolic Panel: Recent Labs  Lab 04/04/24 1628 04/04/24 2307 04/05/24 1220 04/06/24 0710 04/07/24 0823 04/08/24 0421 04/09/24 0220 04/10/24 0401  NA 119*   < > 119* 126* 127* 131* 131* 134*  K 2.7*   < > 3.0* 3.2* 2.9* 4.3 3.7 3.7  CL 83*   < > 83* 90* 91* 96* 96* 98  CO2 24   < > 23 25 24  20* 25 27  GLUCOSE 197*   < > 179* 143*  276* 152* 250* 139*  BUN <5*   < > <5* 7 12 14 11 12   CREATININE 0.39*   < > 0.43* 0.44* 0.47* 0.45* 0.55* 0.68  CALCIUM  7.3*   < > 7.7* 8.2* 8.1* 8.4* 8.5* 8.7*  MG 1.6*  --  1.6*  --   --   --   --   --    < > = values in this interval not displayed.    CBC: Recent Labs  Lab 04/05/24 0206 04/07/24 0823 04/08/24 0421 04/09/24 0220 04/10/24 0401  WBC 12.2* 12.1* 11.2* 11.3* 10.3  NEUTROABS 9.1* 9.4*  --   --   --   HGB 12.0* 12.9* 12.7* 12.7* 12.7*  HCT 33.1* 36.7* 36.5* 37.4* 37.3*  MCV 96.2 99.7 101.4* 103.3* 103.0*  PLT 173 244 240 260 254    LFT Recent Labs  Lab 04/06/24 0710 04/07/24 0823 04/08/24 0421 04/09/24 0220 04/10/24 0401  AST 149* 131* 131* 107* 91*  ALT 57* 63* 69* 75* 81*  ALKPHOS 433* 414* 400* 407* 358*  BILITOT 24.1* 25.6* 24.1* 21.6* 15.9*  PROT 5.8* 5.9* 5.6* 5.9* 5.9*  ALBUMIN  2.9* 2.6* 2.5* 2.6* 2.5*     Antibiotics: Anti-infectives (From admission, onward)    None        DVT prophylaxis: SCDs  Code Status: Full code  Family Communication: No family at bedside   CONSULTS GI, IR   Subjective   Denies any complaints   Objective    Physical Examination:   General-appears in no acute distress Skin-significant icterus noted on the torso, arms and face Heart-S1-S2, regular, no murmur auscultated Lungs-clear to auscultation bilaterally, no wheezing or crackles auscultated Abdomen-soft, nontender, no organomegaly Extremities-no edema in the lower extremities Neuro-alert, oriented x3, no focal deficit noted  Status is: Inpatient:             Sabas GORMAN Brod   [facility] If 7PM-7AM, please contact night-coverage at [email or web address], Office  [phone]   04/10/2024, 3:57 PM  LOS: 7 days

## 2024-04-11 ENCOUNTER — Ambulatory Visit: Payer: Self-pay | Admitting: Gastroenterology

## 2024-04-11 LAB — COMPREHENSIVE METABOLIC PANEL WITH GFR
ALT: 83 U/L — ABNORMAL HIGH (ref 0–44)
AST: 79 U/L — ABNORMAL HIGH (ref 15–41)
Albumin: 2.4 g/dL — ABNORMAL LOW (ref 3.5–5.0)
Alkaline Phosphatase: 324 U/L — ABNORMAL HIGH (ref 38–126)
Anion gap: 12 (ref 5–15)
BUN: 11 mg/dL (ref 6–20)
CO2: 24 mmol/L (ref 22–32)
Calcium: 8.4 mg/dL — ABNORMAL LOW (ref 8.9–10.3)
Chloride: 95 mmol/L — ABNORMAL LOW (ref 98–111)
Creatinine, Ser: 0.55 mg/dL — ABNORMAL LOW (ref 0.61–1.24)
GFR, Estimated: >60 mL/min (ref >60)
Glucose, Bld: 156 mg/dL — ABNORMAL HIGH (ref 70–99)
Potassium: 3.2 mmol/L — ABNORMAL LOW (ref 3.5–5.1)
Sodium: 131 mmol/L — ABNORMAL LOW (ref 135–145)
Total Bilirubin: 12 mg/dL — ABNORMAL HIGH (ref 0.0–1.2)
Total Protein: 5.8 g/dL — ABNORMAL LOW (ref 6.5–8.1)

## 2024-04-11 LAB — GLUCOSE, CAPILLARY
Glucose-Capillary: 244 mg/dL — ABNORMAL HIGH (ref 70–99)
Glucose-Capillary: 80 mg/dL (ref 70–99)
Glucose-Capillary: 284 mg/dL — ABNORMAL HIGH (ref 70–99)
Glucose-Capillary: 220 mg/dL — ABNORMAL HIGH (ref 70–99)

## 2024-04-11 MED ORDER — INSULIN ASPART 100 UNIT/ML IJ SOLN
0.0000 [IU] | Freq: Every day | INTRAMUSCULAR | Status: DC
  Administered 2024-04-11: 2 [IU] via SUBCUTANEOUS

## 2024-04-11 MED ORDER — INSULIN ASPART 100 UNIT/ML IJ SOLN
0.0000 [IU] | Freq: Three times a day (TID) | INTRAMUSCULAR | Status: DC
  Administered 2024-04-11: 8 [IU] via SUBCUTANEOUS
  Administered 2024-04-12: 7 [IU] via SUBCUTANEOUS
  Administered 2024-04-12: 3 [IU] via SUBCUTANEOUS
  Administered 2024-04-12: 9 [IU] via SUBCUTANEOUS

## 2024-04-11 MED ORDER — POTASSIUM CHLORIDE CRYS ER 20 MEQ PO TBCR
40.0000 meq | EXTENDED_RELEASE_TABLET | ORAL | Status: AC
  Administered 2024-04-11 (×2): 40 meq via ORAL
  Filled 2024-04-11 (×2): qty 2

## 2024-04-11 MED ORDER — CARVEDILOL 6.25 MG PO TABS
6.2500 mg | ORAL_TABLET | Freq: Two times a day (BID) | ORAL | Status: DC
  Administered 2024-04-11 – 2024-04-14 (×6): 6.25 mg via ORAL
  Filled 2024-04-11 (×6): qty 1

## 2024-04-11 NOTE — Plan of Care (Signed)

## 2024-04-11 NOTE — Progress Notes (Signed)
 PROGRESS NOTE    Tanner Cooper  FMW:[id number] DOB: 06/26/1981 DOA: 04/03/2024 PCP: Patient, [name]  Chief Complaint  Patient presents with   Abdominal Pain    Brief Narrative:   43 year old homeless Spanish-speaking male Known DM TY 2 not taking meds for 6 months as cannot afford EtOH use habituation 2-3 beers per day-seen in ED 02/23/2024 N/V/persistent emesis. He is being treated for acute alcoholic hepatitis with IV steroids and GI is on board. He has issues with insurance, lost his job and has no place to live.  Assessment & Plan:   Principal Problem:   Alcoholic hepatitis Active Problems:   Acute liver failure   ETOH abuse   Hyponatremia   Metabolic dysfunction-associated steatohepatitis (MASH)   Secondary esophageal varices without bleeding (HCC)   Portal hypertensive gastropathy (HCC)  Homelessness Complicates care and follow up plan.  He doesn't currently have Sparkles Mcneely working phone.  He needs follow up appointments made before he discharges.  Will try to simplify meds where possible.     Acute Alcoholic hepatitis No encephalopathy Will discontinue steroids given his hyperglycemia and complexity of discharging him on steroids + insulin  with homelessness (discussed with GI)   Labs will need to be followed outpatient  Ascites  SBP ruled out s/p 3 L fluid removal on 9/12  ? GI bleed, Hemoccult positive on admission Endoscopy with grade 1 varices, portal hypertensive gastropathy  Chronic Active Helicobacter Associated Gastritis with Focal Intestinal Metaplasia Will discharge with quadruple therapy, appreciate GI recs (9/18)  1) Omeprazole  20 mg 2 times Tyrina Hines day x 14 d 2) Pepto Bismol 2 tabs (262 mg each) 4 times Adalberto Metzgar day x 14 d 3) Metronidazole  250 mg 4 times Skilar Marcou day x 14 d 4) doxycycline  100 mg 2 times Obera Stauch day x 14 d  Severe hypervolemic hyponatremia Thought related to etoh/tea/toast diet and volume overload improved   Etoh Abuse Encourage cessation    DM Type 2   A1c 7.3 Currently hyperglycemia related to steroids, these have been discontinued.  Without steroid induced hyperglycemia, I think he'll be able to do well without insulin  on oral meds.    Will monitor for now    Chronic homelessness -TOC on board    DVT prophylaxis: SCD Code Status: full Family Communication: none Disposition:   Status is: Inpatient Remains inpatient appropriate because: need for continued inpatient care   Consultants:  GI IR   Procedures:  IMPRESSION: Successful ultrasound-guided paracentesis yielding 3.1 liters of peritoneal fluid.  EGD Impression: - The examined portions of the nasopharynx, oropharynx and larynx were normal. - Grade I esophageal varices. Not high risk. No suspicion as bleeding source. - Z- line irregular. - Portal hypertensive gastropathy. Biopsied. - Normal examined duodenum. Moderate Sedation:  N/ Jayla Mackie Recommendation: - Return patient to hospital ward for ongoing care. - Resume previous diet. - Continue present medications. - Await pathology results. - Recommend starting carvedilol  6. 25 mg once daily for variceal bleeding prophylaxis. This can be started at discharge. - Consider starting prednisolone  for alcoholic hepatitis. Although his DF is not > 32 when the control PT time is 15, it is > 32 when Kaius Daino control PT time of 12 is used ( our lab quotes Bentli Llorente normal prothrombin range of 11. 4- 15. 2)  Antimicrobials:  Anti-infectives (From admission, onward)    None       Subjective: No complaints  Objective: Vitals:   04/11/24 0022 04/11/24 0402 04/11/24 0820 04/11/24 1223  BP: 113/65 109/68  104/80 113/84  Pulse: (!) 51 (!) 56 65 62  Resp: 16 16 17 17   Temp: 98.1 F (36.7 C) 98.2 F (36.8 C) 98.3 F (36.8 C) 98.1 F (36.7 C)  TempSrc: Oral Oral Oral Oral  SpO2: 100% 100% 100% 100%  Weight:      Height:        Intake/Output Summary (Last 24 hours) at 04/11/2024 1335 Last data filed at 04/11/2024 0836 Gross per 24 hour  Intake  340 ml  Output --  Net 340 ml   Filed Weights   04/03/24 2115 04/06/24 0839 04/10/24 0500  Weight: 77 kg 77 kg 65.5 kg    Examination:  General: No acute distress. Jaundiced - scleral icterus Cardiovascular: RRR Lungs: unlabored Neurological: Alert and oriented 3. Moves all extremities 4. Cranial nerves II through XII grossly intact. Extremities: No clubbing or cyanosis. No edema.    Data Reviewed: I have personally reviewed following labs and imaging studies  CBC: Recent Labs  Lab 04/05/24 0206 04/07/24 0823 04/08/24 0421 04/09/24 0220 04/10/24 0401  WBC 12.2* 12.1* 11.2* 11.3* 10.3  NEUTROABS 9.1* 9.4*  --   --   --   HGB 12.0* 12.9* 12.7* 12.7* 12.7*  HCT 33.1* 36.7* 36.5* 37.4* 37.3*  MCV 96.2 99.7 101.4* 103.3* 103.0*  PLT 173 244 240 260 254    Basic Metabolic Panel: Recent Labs  Lab 04/04/24 1628 04/04/24 2307 04/05/24 1220 04/06/24 0710 04/07/24 0823 04/08/24 0421 04/09/24 0220 04/10/24 0401 04/11/24 0539  NA 119*   < > 119*   < > 127* 131* 131* 134* 131*  K 2.7*   < > 3.0*   < > 2.9* 4.3 3.7 3.7 3.2*  CL 83*   < > 83*   < > 91* 96* 96* 98 95*  CO2 24   < > 23   < > 24 20* 25 27 24   GLUCOSE 197*   < > 179*   < > 276* 152* 250* 139* 156*  BUN <5*   < > <5*   < > 12 14 11 12 11   CREATININE 0.39*   < > 0.43*   < > 0.47* 0.45* 0.55* 0.68 0.55*  CALCIUM  7.3*   < > 7.7*   < > 8.1* 8.4* 8.5* 8.7* 8.4*  MG 1.6*  --  1.6*  --   --   --   --   --   --    < > = values in this interval not displayed.    GFR: Estimated Creatinine Clearance: 101.8 mL/min (Karry Barrilleaux) (by C-G formula based on SCr of 0.55 mg/dL (L)).  Liver Function Tests: Recent Labs  Lab 04/07/24 0823 04/08/24 0421 04/09/24 0220 04/10/24 0401 04/11/24 0539  AST 131* 131* 107* 91* 79*  ALT 63* 69* 75* 81* 83*  ALKPHOS 414* 400* 407* 358* 324*  BILITOT 25.6* 24.1* 21.6* 15.9* 12.0*  PROT 5.9* 5.6* 5.9* 5.9* 5.8*  ALBUMIN  2.6* 2.5* 2.6* 2.5* 2.4*    CBG: Recent Labs  Lab  04/10/24 1216 04/10/24 1555 04/10/24 2029 04/11/24 0815 04/11/24 1216  GLUCAP 157* 247* 252* 244* 80     Recent Results (from the past 240 hours)  Body fluid culture w Gram Stain     Status: None   Collection Time: 04/03/24  7:35 PM   Specimen: Peritoneal Cavity; Peritoneal Fluid  Result Value Ref Range Status   Specimen Description PERITONEAL CAVITY  Final   Special Requests NONE  Final   Gram Stain NO  WBC SEEN NO ORGANISMS SEEN   Final   Culture   Final    NO GROWTH 3 DAYS Performed at [facility], [address], [place], [place] [address]    Report Status 04/07/2024 FINAL  Final  MRSA Next Gen by PCR, Nasal     Status: Abnormal   Collection Time: 04/03/24  9:13 PM   Specimen: Nasal Swab  Result Value Ref Range Status   MRSA by PCR Next Gen DETECTED (Cambell Rickenbach) NOT DETECTED Final    Comment: RESULT CALLED TO, READ BACK BY AND VERIFIED WITH: RN M. ZERSONO [id number] FCP (NOTE) The GeneXpert MRSA Assay (FDA approved for NASAL specimens only), is one component of Shamarie Call comprehensive MRSA colonization surveillance program. It is not intended to diagnose MRSA infection nor to guide or monitor treatment for MRSA infections. Test performance is not FDA approved in patients less than 30 years old. Performed at [facility], [address] [place], [place] [address]          Radiology Studies: No results found.      Scheduled Meds:  calcium  carbonate  1 tablet Oral BID WC   furosemide   20 mg Oral Daily   insulin  aspart  0-5 Units Subcutaneous QHS   insulin  aspart  0-9 Units Subcutaneous TID WC   lidocaine  (PF)  10 mL Intradermal Once   lidocaine -EPINEPHrine   10 mL Intradermal Once   pantoprazole   40 mg Oral BID   potassium chloride   40 mEq Oral Q4H   propranolol   10 mg Oral BID   spironolactone   100 mg Oral Daily   Continuous Infusions:  magnesium  sulfate bolus IVPB       LOS: 8 days    Time spent: over 30 min     Meliton Monte, MD [facility]   To contact the attending provider between 7A-7P or the covering provider during after hours 7P-7A, please log into the web site [email or web address] and access using universal [place] password for that web site. If you do not have the password, please call the hospital operator.  04/11/2024, 1:35 PM

## 2024-04-12 DIAGNOSIS — K7460 Unspecified cirrhosis of liver: Secondary | ICD-10-CM

## 2024-04-12 LAB — PROTIME-INR
INR: 1.1 (ref 0.8–1.2)
Prothrombin Time: 15.2 s (ref 11.4–15.2)

## 2024-04-12 LAB — CBC WITH DIFFERENTIAL/PLATELET
Abs Immature Granulocytes: 0.4 K/uL — ABNORMAL HIGH (ref 0.00–0.07)
Basophils Absolute: 0 K/uL (ref 0.0–0.1)
Basophils Relative: 0 %
Eosinophils Absolute: 0.2 K/uL (ref 0.0–0.5)
Eosinophils Relative: 1 %
HCT: 37.5 % — ABNORMAL LOW (ref 39.0–52.0)
Hemoglobin: 12.9 g/dL — ABNORMAL LOW (ref 13.0–17.0)
Immature Granulocytes: 3 %
Lymphocytes Relative: 11 %
Lymphs Abs: 1.5 K/uL (ref 0.7–4.0)
MCH: 35.8 pg — ABNORMAL HIGH (ref 26.0–34.0)
MCHC: 34.4 g/dL (ref 30.0–36.0)
MCV: 104.2 fL — ABNORMAL HIGH (ref 80.0–100.0)
Monocytes Absolute: 1.5 K/uL — ABNORMAL HIGH (ref 0.1–1.0)
Monocytes Relative: 11 %
Neutro Abs: 10.2 K/uL — ABNORMAL HIGH (ref 1.7–7.7)
Neutrophils Relative %: 74 %
Platelets: 269 K/uL (ref 150–400)
RBC: 3.6 MIL/uL — ABNORMAL LOW (ref 4.22–5.81)
RDW: 15.1 % (ref 11.5–15.5)
Smear Review: NORMAL
WBC: 13.8 K/uL — ABNORMAL HIGH (ref 4.0–10.5)
nRBC: 0 % (ref 0.0–0.2)

## 2024-04-12 LAB — COMPREHENSIVE METABOLIC PANEL WITH GFR
ALT: 89 U/L — ABNORMAL HIGH (ref 0–44)
AST: 95 U/L — ABNORMAL HIGH (ref 15–41)
Albumin: 2.4 g/dL — ABNORMAL LOW (ref 3.5–5.0)
Alkaline Phosphatase: 371 U/L — ABNORMAL HIGH (ref 38–126)
Anion gap: 12 (ref 5–15)
BUN: 12 mg/dL (ref 6–20)
CO2: 24 mmol/L (ref 22–32)
Calcium: 8.5 mg/dL — ABNORMAL LOW (ref 8.9–10.3)
Chloride: 92 mmol/L — ABNORMAL LOW (ref 98–111)
Creatinine, Ser: 0.65 mg/dL (ref 0.61–1.24)
GFR, Estimated: >60 mL/min (ref >60)
Glucose, Bld: 252 mg/dL — ABNORMAL HIGH (ref 70–99)
Potassium: 4 mmol/L (ref 3.5–5.1)
Sodium: 128 mmol/L — ABNORMAL LOW (ref 135–145)
Total Bilirubin: 11.3 mg/dL — ABNORMAL HIGH (ref 0.0–1.2)
Total Protein: 6 g/dL — ABNORMAL LOW (ref 6.5–8.1)

## 2024-04-12 LAB — PHOSPHORUS: Phosphorus: 4.1 mg/dL (ref 2.5–4.6)

## 2024-04-12 LAB — GLUCOSE, CAPILLARY
Glucose-Capillary: 353 mg/dL — ABNORMAL HIGH (ref 70–99)
Glucose-Capillary: 218 mg/dL — ABNORMAL HIGH (ref 70–99)
Glucose-Capillary: 280 mg/dL — ABNORMAL HIGH (ref 70–99)
Glucose-Capillary: 233 mg/dL — ABNORMAL HIGH (ref 70–99)

## 2024-04-12 LAB — MAGNESIUM: Magnesium: 1.8 mg/dL (ref 1.7–2.4)

## 2024-04-12 MED ORDER — MAGNESIUM OXIDE -MG SUPPLEMENT 400 (240 MG) MG PO TABS
400.0000 mg | ORAL_TABLET | Freq: Every day | ORAL | Status: DC
  Administered 2024-04-12 – 2024-04-14 (×3): 400 mg via ORAL
  Filled 2024-04-12 (×3): qty 1

## 2024-04-12 MED ORDER — LACTATED RINGERS IV BOLUS
250.0000 mL | Freq: Once | INTRAVENOUS | Status: AC
  Administered 2024-04-12: 250 mL via INTRAVENOUS

## 2024-04-12 MED ORDER — INSULIN ASPART 100 UNIT/ML IJ SOLN
0.0000 [IU] | Freq: Every day | INTRAMUSCULAR | Status: DC
  Administered 2024-04-12: 1 [IU] via SUBCUTANEOUS

## 2024-04-12 MED ORDER — SPIRONOLACTONE 25 MG PO TABS
50.0000 mg | ORAL_TABLET | Freq: Every day | ORAL | Status: DC
Start: 2024-04-13 — End: 2024-04-14
  Administered 2024-04-13 – 2024-04-14 (×2): 50 mg via ORAL
  Filled 2024-04-12 (×2): qty 2

## 2024-04-12 MED ORDER — INSULIN ASPART 100 UNIT/ML IJ SOLN
0.0000 [IU] | Freq: Three times a day (TID) | INTRAMUSCULAR | Status: DC
  Administered 2024-04-13: 5 [IU] via SUBCUTANEOUS
  Administered 2024-04-13: 3 [IU] via SUBCUTANEOUS
  Administered 2024-04-13: 11 [IU] via SUBCUTANEOUS
  Administered 2024-04-14: 8 [IU] via SUBCUTANEOUS
  Administered 2024-04-14: 5 [IU] via SUBCUTANEOUS

## 2024-04-12 NOTE — Progress Notes (Signed)
 PROGRESS NOTE    Tanner Cooper  FMW:[id number] DOB: 1981-05-08 DOA: 04/03/2024 PCP: Patient, [name]  Chief Complaint  Patient presents with   Abdominal Pain    Brief Narrative:   43 year old homeless Spanish-speaking male Known DM TY 2 not taking meds for 6 months as cannot afford EtOH use habituation 2-3 beers per day-seen in ED 02/23/2024 N/V/persistent emesis. He is being treated for acute alcoholic hepatitis with IV steroids and GI is on board. He has issues with insurance, lost his job and has no place to live.  Assessment & Plan:   Principal Problem:   Alcoholic hepatitis Active Problems:   Acute liver failure   ETOH abuse   Hyponatremia   Metabolic dysfunction-associated steatohepatitis (MASH)   Secondary esophageal varices without bleeding (HCC)   Portal hypertensive gastropathy (HCC)  Homelessness Complicates care and follow up plan.  He doesn't currently have Dashonda Bonneau working phone.  He needs follow up appointments made before he discharges.  Will try to simplify meds where possible.     Hypotension Cramping Getting volume down? Follow repeat BP and orthostatics. Hold lasix , reduce spironolactone  Consider IVF  Acute Alcoholic hepatitis No encephalopathy Will discontinue steroids given his hyperglycemia and complexity of discharging him on steroids + insulin  with homelessness (discussed with GI)   Labs will need to be followed outpatient  Ascites  SBP ruled out s/p 3 L fluid removal on 9/12 Spironolactone  reduced, hold lasix  for now with hypotension above  ? GI bleed, Hemoccult positive on admission Endoscopy with grade 1 varices, portal hypertensive gastropathy  Chronic Active Helicobacter Associated Gastritis with Focal Intestinal Metaplasia Will discharge with quadruple therapy, appreciate GI recs (9/18)  1) Omeprazole  20 mg 2 times Huyen Perazzo day x 14 d 2) Pepto Bismol 2 tabs (262 mg each) 4 times Shabre Kreher day x 14 d 3) Metronidazole  250 mg 4 times Arah Aro day x 14 d 4)  doxycycline  100 mg 2 times Italo Banton day x 14 d  Severe hypervolemic hyponatremia Thought related to etoh/tea/toast diet and volume overload More hyponatremic today, will trend for now (getting hypovolemic with low Bps?)  Etoh Abuse Encourage cessation    DM Type 2  A1c 7.3 Currently hyperglycemia related to steroids, these have been discontinued.  Without steroid induced hyperglycemia, I think he'll be able to do well without insulin  on oral meds.    Will monitor for now - still hyperglycemic - carb mod diet and trend   Chronic homelessness -TOC on board    DVT prophylaxis: SCD Code Status: full Family Communication: none Disposition:   Status is: Inpatient Remains inpatient appropriate because: need for continued inpatient care   Consultants:  GI IR   Procedures:  IMPRESSION: Successful ultrasound-guided paracentesis yielding 3.1 liters of peritoneal fluid.  EGD Impression: - The examined portions of the nasopharynx, oropharynx and larynx were normal. - Grade I esophageal varices. Not high risk. No suspicion as bleeding source. - Z- line irregular. - Portal hypertensive gastropathy. Biopsied. - Normal examined duodenum. Moderate Sedation:  N/ Eusebia Grulke Recommendation: - Return patient to hospital ward for ongoing care. - Resume previous diet. - Continue present medications. - Await pathology results. - Recommend starting carvedilol  6. 25 mg once daily for variceal bleeding prophylaxis. This can be started at discharge. - Consider starting prednisolone  for alcoholic hepatitis. Although his DF is not > 32 when the control PT time is 15, it is > 32 when Jaeley Wiker control PT time of 12 is used ( our lab quotes Clela Hagadorn normal  prothrombin range of 11. 4- 15. 2)  Antimicrobials:  Anti-infectives (From admission, onward)    None       Subjective: No new complaints  Objective: Vitals:   04/11/24 2008 04/12/24 0443 04/12/24 0900 04/12/24 1158  BP: 98/69 98/74 99/70  (!) 83/65  Pulse: 63 86 99    Resp: 18 16 16 17   Temp: 99.2 F (37.3 C) 98.2 F (36.8 C) 98.2 F (36.8 C) 97.8 F (36.6 C)  TempSrc: Oral Oral Oral   SpO2: 100% 100%  100%  Weight:      Height:        Intake/Output Summary (Last 24 hours) at 04/12/2024 1543 Last data filed at 04/12/2024 0500 Gross per 24 hour  Intake 1030 ml  Output --  Net 1030 ml   Filed Weights   04/03/24 2115 04/06/24 0839 04/10/24 0500  Weight: 77 kg 77 kg 65.5 kg    Examination:  General: No acute distress. Jaundiced, scleral icterus  Lungs: unlabored Neurological: Alert and oriented 3. Moves all extremities 4 . Cranial nerves II through XII grossly intact. Extremities: No clubbing or cyanosis. No edema.   Data Reviewed: I have personally reviewed following labs and imaging studies  CBC: Recent Labs  Lab 04/07/24 0823 04/08/24 0421 04/09/24 0220 04/10/24 0401 04/12/24 0522  WBC 12.1* 11.2* 11.3* 10.3 13.8*  NEUTROABS 9.4*  --   --   --  10.2*  HGB 12.9* 12.7* 12.7* 12.7* 12.9*  HCT 36.7* 36.5* 37.4* 37.3* 37.5*  MCV 99.7 101.4* 103.3* 103.0* 104.2*  PLT 244 240 260 254 269    Basic Metabolic Panel: Recent Labs  Lab 04/08/24 0421 04/09/24 0220 04/10/24 0401 04/11/24 0539 04/12/24 0522  NA 131* 131* 134* 131* 128*  K 4.3 3.7 3.7 3.2* 4.0  CL 96* 96* 98 95* 92*  CO2 20* 25 27 24 24   GLUCOSE 152* 250* 139* 156* 252*  BUN 14 11 12 11 12   CREATININE 0.45* 0.55* 0.68 0.55* 0.65  CALCIUM  8.4* 8.5* 8.7* 8.4* 8.5*  MG  --   --   --   --  1.8  PHOS  --   --   --   --  4.1    GFR: Estimated Creatinine Clearance: 101.8 mL/min (by C-G formula based on SCr of 0.65 mg/dL).  Liver Function Tests: Recent Labs  Lab 04/08/24 0421 04/09/24 0220 04/10/24 0401 04/11/24 0539 04/12/24 0522  AST 131* 107* 91* 79* 95*  ALT 69* 75* 81* 83* 89*  ALKPHOS 400* 407* 358* 324* 371*  BILITOT 24.1* 21.6* 15.9* 12.0* 11.3*  PROT 5.6* 5.9* 5.9* 5.8* 6.0*  ALBUMIN  2.5* 2.6* 2.5* 2.4* 2.4*    CBG: Recent Labs  Lab  04/11/24 1216 04/11/24 1646 04/11/24 2017 04/12/24 0824 04/12/24 1142  GLUCAP 80 284* 220* 353* 218*     Recent Results (from the past 240 hours)  Body fluid culture w Gram Stain     Status: None   Collection Time: 04/03/24  7:35 PM   Specimen: Peritoneal Cavity; Peritoneal Fluid  Result Value Ref Range Status   Specimen Description PERITONEAL CAVITY  Final   Special Requests NONE  Final   Gram Stain NO WBC SEEN NO ORGANISMS SEEN   Final   Culture   Final    NO GROWTH 3 DAYS Performed at [facility], [address], [place], [place] [address]    Report Status 04/07/2024 FINAL  Final  MRSA Next Gen by PCR, Nasal  Status: Abnormal   Collection Time: 04/03/24  9:13 PM   Specimen: Nasal Swab  Result Value Ref Range Status   MRSA by PCR Next Gen DETECTED (Jamiria Langill) NOT DETECTED Final    Comment: RESULT CALLED TO, READ BACK BY AND VERIFIED WITH: RN M. ZERSONO [id number] FCP (NOTE) The GeneXpert MRSA Assay (FDA approved for NASAL specimens only), is one component of Tattianna Schnarr comprehensive MRSA colonization surveillance program. It is not intended to diagnose MRSA infection nor to guide or monitor treatment for MRSA infections. Test performance is not FDA approved in patients less than 68 years old. Performed at [facility], [address] [place], [place] [address]          Radiology Studies: No results found.      Scheduled Meds:  calcium  carbonate  1 tablet Oral BID WC   carvedilol   6.25 mg Oral BID WC   furosemide   20 mg Oral Daily   insulin  aspart  0-5 Units Subcutaneous QHS   insulin  aspart  0-9 Units Subcutaneous TID WC   lidocaine  (PF)  10 mL Intradermal Once   lidocaine -EPINEPHrine   10 mL Intradermal Once   magnesium  oxide  400 mg Oral Daily   pantoprazole   40 mg Oral BID   spironolactone   100 mg Oral Daily   Continuous Infusions:     LOS: 9 days    Time spent: over 30 min     Meliton Monte, MD [facility]   To contact  the attending provider between 7A-7P or the covering provider during after hours 7P-7A, please log into the web site [email or web address] and access using universal [place] password for that web site. If you do not have the password, please call the hospital operator.  04/12/2024, 3:43 PM

## 2024-04-13 DIAGNOSIS — K7460 Unspecified cirrhosis of liver: Secondary | ICD-10-CM

## 2024-04-13 LAB — CBC WITH DIFFERENTIAL/PLATELET
Abs Immature Granulocytes: 0.47 K/uL — ABNORMAL HIGH (ref 0.00–0.07)
Basophils Absolute: 0 K/uL (ref 0.0–0.1)
Basophils Relative: 0 %
Eosinophils Absolute: 0.3 K/uL (ref 0.0–0.5)
Eosinophils Relative: 2 %
HCT: 37.2 % — ABNORMAL LOW (ref 39.0–52.0)
Hemoglobin: 12.5 g/dL — ABNORMAL LOW (ref 13.0–17.0)
Immature Granulocytes: 3 %
Lymphocytes Relative: 10 %
Lymphs Abs: 1.4 K/uL (ref 0.7–4.0)
MCH: 35.5 pg — ABNORMAL HIGH (ref 26.0–34.0)
MCHC: 33.6 g/dL (ref 30.0–36.0)
MCV: 105.7 fL — ABNORMAL HIGH (ref 80.0–100.0)
Monocytes Absolute: 1.1 K/uL — ABNORMAL HIGH (ref 0.1–1.0)
Monocytes Relative: 8 %
Neutro Abs: 10.8 K/uL — ABNORMAL HIGH (ref 1.7–7.7)
Neutrophils Relative %: 77 %
Platelets: 264 K/uL (ref 150–400)
RBC: 3.52 MIL/uL — ABNORMAL LOW (ref 4.22–5.81)
RDW: 14.8 % (ref 11.5–15.5)
Smear Review: NORMAL
WBC: 14 K/uL — ABNORMAL HIGH (ref 4.0–10.5)
nRBC: 0 % (ref 0.0–0.2)

## 2024-04-13 LAB — COMPREHENSIVE METABOLIC PANEL WITH GFR
ALT: 85 U/L — ABNORMAL HIGH (ref 0–44)
AST: 92 U/L — ABNORMAL HIGH (ref 15–41)
Albumin: 2.4 g/dL — ABNORMAL LOW (ref 3.5–5.0)
Alkaline Phosphatase: 341 U/L — ABNORMAL HIGH (ref 38–126)
Anion gap: 10 (ref 5–15)
BUN: 12 mg/dL (ref 6–20)
CO2: 23 mmol/L (ref 22–32)
Calcium: 8.5 mg/dL — ABNORMAL LOW (ref 8.9–10.3)
Chloride: 94 mmol/L — ABNORMAL LOW (ref 98–111)
Creatinine, Ser: 0.65 mg/dL (ref 0.61–1.24)
GFR, Estimated: >60 mL/min (ref >60)
Glucose, Bld: 342 mg/dL — ABNORMAL HIGH (ref 70–99)
Potassium: 4.9 mmol/L (ref 3.5–5.1)
Sodium: 127 mmol/L — ABNORMAL LOW (ref 135–145)
Total Bilirubin: 10.9 mg/dL — ABNORMAL HIGH (ref 0.0–1.2)
Total Protein: 5.6 g/dL — ABNORMAL LOW (ref 6.5–8.1)

## 2024-04-13 LAB — GLUCOSE, CAPILLARY
Glucose-Capillary: 325 mg/dL — ABNORMAL HIGH (ref 70–99)
Glucose-Capillary: 165 mg/dL — ABNORMAL HIGH (ref 70–99)
Glucose-Capillary: 235 mg/dL — ABNORMAL HIGH (ref 70–99)
Glucose-Capillary: 104 mg/dL — ABNORMAL HIGH (ref 70–99)

## 2024-04-13 MED ORDER — GLIPIZIDE 5 MG PO TABS
2.5000 mg | ORAL_TABLET | Freq: Two times a day (BID) | ORAL | Status: DC
  Administered 2024-04-13 – 2024-04-14 (×2): 2.5 mg via ORAL
  Filled 2024-04-13 (×3): qty 0.5

## 2024-04-13 NOTE — Progress Notes (Signed)
 PROGRESS NOTE    Tanner Cooper  FMW:[id number] DOB: 27-Aug-1980 DOA: 04/03/2024 PCP: Patient, [name]  Chief Complaint  Patient presents with   Abdominal Pain    Brief Narrative:   43 year old homeless Spanish-speaking male Known DM TY 2 not taking meds for 6 months as cannot afford EtOH use habituation 2-3 beers per day-seen in ED 02/23/2024 N/V/persistent emesis. He is being treated for acute alcoholic hepatitis with IV steroids and GI is on board. He has issues with insurance, lost his job and has no place to live.  Assessment & Plan:   Principal Problem:   Alcoholic hepatitis Active Problems:   Acute liver failure   ETOH abuse   Hyponatremia   Metabolic dysfunction-associated steatohepatitis (MASH)   Secondary esophageal varices without bleeding (HCC)   Portal hypertensive gastropathy (HCC)   Cirrhosis of liver without ascites (HCC)  Homelessness Complicates care and follow up plan.  He doesn't currently have [employer] working phone.  He needs follow up appointments made before he discharges.  Will try to simplify meds where possible.     DM Type 2  Steroid Induced Hyperglycemia A1c 7.3 Currently hyperglycemia related to steroids, these have been discontinued.  Without steroid induced hyperglycemia, I think he'll be able to do well without insulin  on oral meds.    Will monitor for now - still hyperglycemic this AM - start glipizide , will avoid metformin with his liver dz - carb mod diet and trend  Hypotension Cramping Borderline orthostatics, but asymptomatic Hold lasix , reduce spironolactone   Acute Alcoholic hepatitis No encephalopathy Will discontinue steroids given his hyperglycemia and complexity of discharging him on steroids + insulin  with homelessness (discussed with GI)   Labs will need to be followed outpatient  Ascites  SBP ruled out s/p 3 L fluid removal on 9/12 Spironolactone  reduced, hold lasix  for now with hypotension above  ? 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 Will discharge with quadruple therapy, appreciate GI recs (9/18)  1) Omeprazole  20 mg 2 times Jamyia Fortune day x 14 d 2) Pepto Bismol 2 tabs (262 mg each) 4 times Petrina Melby day x 14 d 3) Metronidazole  250 mg 4 times Mallie Linnemann day x 14 d 4) doxycycline  100 mg 2 times Tempie Gibeault day x 14 d  Severe hypervolemic hyponatremia Thought related to etoh/tea/toast diet and volume overload Labs pending today  Etoh Abuse Encourage cessation    Chronic homelessness -TOC on board    DVT prophylaxis: SCD Code Status: full Family Communication: none Disposition:   Status is: Inpatient Remains inpatient appropriate because: need for continued inpatient care   Consultants:  GI IR   Procedures:  IMPRESSION: Successful ultrasound-guided paracentesis yielding 3.1 liters of peritoneal fluid.  EGD Impression: - The examined portions of the nasopharynx, oropharynx and larynx were normal. - Grade I esophageal varices. Not high risk. No suspicion as bleeding source. - Z- line irregular. - Portal hypertensive gastropathy. Biopsied. - Normal examined duodenum. Moderate Sedation:  N/ Kohl Polinsky Recommendation: - Return patient to hospital ward for ongoing care. - Resume previous diet. - Continue present medications. - Await pathology results. - Recommend starting carvedilol  6. 25 mg once daily for variceal bleeding prophylaxis. This can be started at discharge. - Consider starting prednisolone  for alcoholic hepatitis. Although his DF is not > 32 when the control PT time is 15, it is > 32 when Daymien Goth control PT time of 12 is  used ( our lab quotes Aryana Wonnacott normal prothrombin range of 11. 4- 15. 2)  Antimicrobials:  Anti-infectives (From admission, onward)    None       Subjective: No new complaints  Objective: Vitals:   04/12/24 1505 04/12/24 2149 04/13/24 0405 04/13/24 0747   BP: 118/79 109/81 (!) 84/66 93/66  Pulse: 81 72 68 85  Resp: 17     Temp: 98.7 F (37.1 C) 98.7 F (37.1 C) 98 F (36.7 C) 98.3 F (36.8 C)  TempSrc:    Oral  SpO2:  100% 98% 100%  Weight:      Height:       No intake or output data in the 24 hours ending 04/13/24 0938  Filed Weights   04/03/24 2115 04/06/24 0839 04/10/24 0500  Weight: 77 kg 77 kg 65.5 kg    Examination:  General: No acute distress. jaundiced Lungs: unlabored Neurological: Alert and oriented 3. Moves all extremities 4 with equal strength. Cranial nerves II through XII grossly intact. Extremities: No clubbing or cyanosis. No edema.   Data Reviewed: I have personally reviewed following labs and imaging studies  CBC: Recent Labs  Lab 04/07/24 0823 04/08/24 0421 04/09/24 0220 04/10/24 0401 04/12/24 0522  WBC 12.1* 11.2* 11.3* 10.3 13.8*  NEUTROABS 9.4*  --   --   --  10.2*  HGB 12.9* 12.7* 12.7* 12.7* 12.9*  HCT 36.7* 36.5* 37.4* 37.3* 37.5*  MCV 99.7 101.4* 103.3* 103.0* 104.2*  PLT 244 240 260 254 269    Basic Metabolic Panel: Recent Labs  Lab 04/08/24 0421 04/09/24 0220 04/10/24 0401 04/11/24 0539 04/12/24 0522  NA 131* 131* 134* 131* 128*  K 4.3 3.7 3.7 3.2* 4.0  CL 96* 96* 98 95* 92*  CO2 20* 25 27 24 24   GLUCOSE 152* 250* 139* 156* 252*  BUN 14 11 12 11 12   CREATININE 0.45* 0.55* 0.68 0.55* 0.65  CALCIUM  8.4* 8.5* 8.7* 8.4* 8.5*  MG  --   --   --   --  1.8  PHOS  --   --   --   --  4.1    GFR: Estimated Creatinine Clearance: 101.8 mL/min (by C-G formula based on SCr of 0.65 mg/dL).  Liver Function Tests: Recent Labs  Lab 04/08/24 0421 04/09/24 0220 04/10/24 0401 04/11/24 0539 04/12/24 0522  AST 131* 107* 91* 79* 95*  ALT 69* 75* 81* 83* 89*  ALKPHOS 400* 407* 358* 324* 371*  BILITOT 24.1* 21.6* 15.9* 12.0* 11.3*  PROT 5.6* 5.9* 5.9* 5.8* 6.0*  ALBUMIN  2.5* 2.6* 2.5* 2.4* 2.4*    CBG: Recent Labs  Lab 04/12/24 0824 04/12/24 1142 04/12/24 1623  04/12/24 2222 04/13/24 0748  GLUCAP 353* 218* 280* 233* 325*     Recent Results (from the past 240 hours)  Body fluid culture w Gram Stain     Status: None   Collection Time: 04/03/24  7:35 PM   Specimen: Peritoneal Cavity; Peritoneal Fluid  Result Value Ref Range Status   Specimen Description PERITONEAL CAVITY  Final   Special Requests NONE  Final   Gram Stain NO WBC SEEN NO ORGANISMS SEEN   Final   Culture   Final    NO GROWTH 3 DAYS Performed at [facility], [address], [place], [place] [address]    Report Status 04/07/2024 FINAL  Final  MRSA Next Gen by PCR, Nasal     Status: Abnormal   Collection Time: 04/03/24  9:13 PM   Specimen:  Nasal Swab  Result Value Ref Range Status   MRSA by PCR Next Gen DETECTED (Darron Stuck) NOT DETECTED Final    Comment: RESULT CALLED TO, READ BACK BY AND VERIFIED WITH: RN M. ZERSONO [phone] FCP (NOTE) The GeneXpert MRSA Assay (FDA approved for NASAL specimens only), is one component of [place] [place] comprehensive MRSA colonization surveillance program. It is not intended to diagnose MRSA infection nor to guide or monitor treatment for MRSA infections. Test performance is not FDA approved in patients less than 60 years old. Performed at [facility], [address], [place], [place] [address]          Radiology Studies: No results found.      Scheduled Meds:  calcium  carbonate  1 tablet Oral BID WC   carvedilol   6.25 mg Oral BID WC   glipiZIDE   2.5 mg Oral BID AC   insulin  aspart  0-15 Units Subcutaneous TID WC   insulin  aspart  0-5 Units Subcutaneous QHS   lidocaine  (PF)  10 mL Intradermal Once   lidocaine -EPINEPHrine   10 mL Intradermal Once   magnesium  oxide  400 mg Oral Daily   pantoprazole   40 mg Oral BID   spironolactone   50 mg Oral Daily   Continuous Infusions:     LOS: 10 days    Time spent: over 30 min     Meliton Monte, MD [facility]   To contact the attending provider between 7A-7P or the  covering provider during after hours 7P-7A, please log into the web site [email or web address] and access using universal [place] password for that web site. If you do not have the password, please call the hospital operator.  04/13/2024, 9:38 AM

## 2024-04-13 NOTE — Inpatient Diabetes Management (Signed)
 Inpatient Diabetes Program Recommendations  AACE/ADA: New Consensus Statement on Inpatient Glycemic Control (2015)  Target Ranges:  Prepandial:   less than 140 mg/dL      Peak postprandial:   less than 180 mg/dL (1-2 hours)      Critically ill patients:  140 - 180 mg/dL   Lab Results  Component Value Date   GLUCAP 165 (H) 04/13/2024   HGBA1C 7.3 (H) 04/04/2024    Review of Glycemic Control  Latest Reference Range & Units 04/11/24 20:17 04/12/24 08:24 04/12/24 11:42 04/12/24 16:23 04/12/24 22:22 04/13/24 07:48 04/13/24 11:43  Glucose-Capillary 70 - 99 mg/dL 779 (H) 646 (H) 781 (H) 280 (H) 233 (H) 325 (H) 165 (H)  (H): Data is abnormally high Diabetes history: Type 2 DM Outpatient Diabetes medications: none Current orders for Inpatient glycemic control: Glipizide  2.5 mg BID, Novolog  0-15 units TID & HS  Inpatient Diabetes Program Recommendations:    Noted changes this AM. Still continues to remain >250-300 mg/dL. If glucose remains elevated, could also consider Prandin TID? Secure chat sent to MD to discuss. Follow.  Thanks, Tinnie Minus, MSN, RNC-OB Diabetes Coordinator [phone] (8a-5p)

## 2024-04-13 NOTE — Plan of Care (Signed)

## 2024-04-13 NOTE — Plan of Care (Signed)
  Problem: Coping: Goal: Ability to adjust to condition or change in health will improve Outcome: Progressing   Problem: Fluid Volume: Goal: Ability to maintain a balanced intake and output will improve Outcome: Progressing   Problem: Metabolic: Goal: Ability to maintain appropriate glucose levels will improve Outcome: Progressing   Problem: Nutritional: Goal: Maintenance of adequate nutrition will improve Outcome: Progressing Goal: Progress toward achieving an optimal weight will improve Outcome: Progressing   Problem: Skin Integrity: Goal: Risk for impaired skin integrity will decrease Outcome: Progressing   Problem: Education: Goal: Knowledge of General Education information will improve Description: Including pain rating scale, medication(s)/side effects and non-pharmacologic comfort measures Outcome: Progressing   Problem: Health Behavior/Discharge Planning: Goal: Ability to manage health-related needs will improve Outcome: Progressing   Problem: Clinical Measurements: Goal: Ability to maintain clinical measurements within normal limits will improve Outcome: Progressing Goal: Will remain free from infection Outcome: Progressing Goal: Diagnostic test results will improve Outcome: Progressing Goal: Respiratory complications will improve Outcome: Progressing Goal: Cardiovascular complication will be avoided Outcome: Progressing   Problem: Activity: Goal: Risk for activity intolerance will decrease Outcome: Progressing   Problem: Nutrition: Goal: Adequate nutrition will be maintained Outcome: Progressing   Problem: Coping: Goal: Level of anxiety will decrease Outcome: Progressing   Problem: Elimination: Goal: Will not experience complications related to bowel motility Outcome: Progressing Goal: Will not experience complications related to urinary retention Outcome: Progressing   Problem: Pain Managment: Goal: General experience of comfort will improve  and/or be controlled Outcome: Progressing   Problem: Safety: Goal: Ability to remain free from injury will improve Outcome: Progressing   Problem: Health Behavior/Discharge Planning: Goal: Ability to identify and utilize available resources and services will improve Outcome: Not Progressing Goal: Ability to manage health-related needs will improve Outcome: Not Progressing

## 2024-04-14 ENCOUNTER — Other Ambulatory Visit (HOSPITAL_COMMUNITY): Payer: Self-pay

## 2024-04-14 LAB — COMPREHENSIVE METABOLIC PANEL WITH GFR
ALT: 75 U/L — ABNORMAL HIGH (ref 0–44)
AST: 88 U/L — ABNORMAL HIGH (ref 15–41)
Albumin: 2.3 g/dL — ABNORMAL LOW (ref 3.5–5.0)
Alkaline Phosphatase: 348 U/L — ABNORMAL HIGH (ref 38–126)
Anion gap: 8 (ref 5–15)
BUN: 9 mg/dL (ref 6–20)
CO2: 26 mmol/L (ref 22–32)
Calcium: 8.3 mg/dL — ABNORMAL LOW (ref 8.9–10.3)
Chloride: 96 mmol/L — ABNORMAL LOW (ref 98–111)
Creatinine, Ser: 0.67 mg/dL (ref 0.61–1.24)
GFR, Estimated: >60 mL/min (ref >60)
Glucose, Bld: 94 mg/dL (ref 70–99)
Potassium: 4.4 mmol/L (ref 3.5–5.1)
Sodium: 130 mmol/L — ABNORMAL LOW (ref 135–145)
Total Bilirubin: 9.5 mg/dL — ABNORMAL HIGH (ref 0.0–1.2)
Total Protein: 5.7 g/dL — ABNORMAL LOW (ref 6.5–8.1)

## 2024-04-14 LAB — CBC WITH DIFFERENTIAL/PLATELET
Abs Immature Granulocytes: 0.5 K/uL — ABNORMAL HIGH (ref 0.00–0.07)
Basophils Absolute: 0 K/uL (ref 0.0–0.1)
Basophils Relative: 0 %
Eosinophils Absolute: 0.4 K/uL (ref 0.0–0.5)
Eosinophils Relative: 3 %
HCT: 36 % — ABNORMAL LOW (ref 39.0–52.0)
Hemoglobin: 12.2 g/dL — ABNORMAL LOW (ref 13.0–17.0)
Lymphocytes Relative: 14 %
Lymphs Abs: 2.7 K/uL (ref 0.7–4.0)
MCH: 35.3 pg — ABNORMAL HIGH (ref 26.0–34.0)
MCHC: 33.9 g/dL (ref 30.0–36.0)
MCV: 104 fL — ABNORMAL HIGH (ref 80.0–100.0)
Monocytes Absolute: 1.8 K/uL — ABNORMAL HIGH (ref 0.1–1.0)
Monocytes Relative: 10 %
Neutro Abs: 7.5 K/uL (ref 1.7–7.7)
Neutrophils Relative %: 73 %
Platelets: 258 K/uL (ref 150–400)
RBC: 3.46 MIL/uL — ABNORMAL LOW (ref 4.22–5.81)
RDW: 14.6 % (ref 11.5–15.5)
Smear Review: NORMAL
WBC: 12.9 K/uL — ABNORMAL HIGH (ref 4.0–10.5)
nRBC: 0 % (ref 0.0–0.2)

## 2024-04-14 LAB — GLUCOSE, CAPILLARY
Glucose-Capillary: 294 mg/dL — ABNORMAL HIGH (ref 70–99)
Glucose-Capillary: 225 mg/dL — ABNORMAL HIGH (ref 70–99)

## 2024-04-14 MED ORDER — OMEPRAZOLE 20 MG PO CPDR
20.0000 mg | DELAYED_RELEASE_CAPSULE | Freq: Two times a day (BID) | ORAL | 0 refills | Status: AC
  Filled 2024-04-14 (×2): qty 28, 14d supply, fill #0

## 2024-04-14 MED ORDER — BISMUTH SUBSALICYLATE 262 MG PO CHEW
524.0000 mg | CHEWABLE_TABLET | Freq: Two times a day (BID) | ORAL | 0 refills | Status: AC
Start: 2024-04-14 — End: 2024-04-28
  Filled 2024-04-14: qty 56, 14d supply, fill #0

## 2024-04-14 MED ORDER — DIPHENHYDRAMINE HCL 25 MG PO CAPS
25.0000 mg | ORAL_CAPSULE | Freq: Once | ORAL | Status: DC | PRN
  Filled 2024-04-14 (×2): qty 1

## 2024-04-14 MED ORDER — SPIRONOLACTONE 50 MG PO TABS
50.0000 mg | ORAL_TABLET | Freq: Every day | ORAL | 0 refills | Status: AC
  Filled 2024-04-14: qty 30, 30d supply, fill #0

## 2024-04-14 MED ORDER — DOXYCYCLINE HYCLATE 100 MG PO TABS
100.0000 mg | ORAL_TABLET | Freq: Two times a day (BID) | ORAL | 0 refills | Status: AC
Start: 2024-04-14 — End: 2024-04-28
  Filled 2024-04-14: qty 28, 14d supply, fill #0

## 2024-04-14 MED ORDER — LANCET DEVICE MISC
1.0000 | Freq: Three times a day (TID) | 0 refills | Status: AC
  Filled 2024-04-14: qty 1, 30d supply, fill #0

## 2024-04-14 MED ORDER — BLOOD GLUCOSE MONITOR SYSTEM W/DEVICE KIT
1.0000 | PACK | Freq: Three times a day (TID) | 0 refills | Status: AC
  Filled 2024-04-14: qty 1, 30d supply, fill #0

## 2024-04-14 MED ORDER — METRONIDAZOLE 250 MG PO TABS
250.0000 mg | ORAL_TABLET | Freq: Two times a day (BID) | ORAL | 0 refills | Status: AC
  Filled 2024-04-14: qty 28, 14d supply, fill #0

## 2024-04-14 MED ORDER — BLOOD GLUCOSE TEST VI STRP
1.0000 | ORAL_STRIP | Freq: Three times a day (TID) | 0 refills | Status: AC
Start: 2024-04-14 — End: 2024-05-14
  Filled 2024-04-14: qty 100, 30d supply, fill #0

## 2024-04-14 MED ORDER — ACCU-CHEK SOFTCLIX LANCETS MISC
1.0000 | Freq: Three times a day (TID) | 0 refills | Status: AC
Start: 2024-04-14 — End: 2024-05-14
  Filled 2024-04-14: qty 100, 30d supply, fill #0

## 2024-04-14 MED ORDER — CARVEDILOL 6.25 MG PO TABS
6.2500 mg | ORAL_TABLET | Freq: Two times a day (BID) | ORAL | 0 refills | Status: AC
  Filled 2024-04-14: qty 60, 30d supply, fill #0

## 2024-04-14 MED ORDER — GLIPIZIDE 5 MG PO TABS
2.5000 mg | ORAL_TABLET | Freq: Two times a day (BID) | ORAL | 0 refills | Status: DC
  Filled 2024-04-14: qty 30, 30d supply, fill #0

## 2024-04-14 NOTE — Progress Notes (Signed)
 Patient's biopsy results discussed while he was still in the hospital.  And he was discharged on quadruple therapy. He has a follow-up scheduled with us  in November.

## 2024-04-14 NOTE — Discharge Summary (Addendum)
 Physician Discharge Summary  Tanner Cooper FMW:[id number] DOB: 09-Jun-1981 DOA: 04/03/2024  PCP: Patient, [name]  Admit date: 04/03/2024 Discharge date: 04/14/2024  Time spent: 40 minutes  Recommendations for Outpatient Follow-up:  Follow outpatient CBC/CMP  Follow with GI outpatient as scheduled - suspect underlying cirrhosis  Follow with PCP outpatient (appt scheduled for tmrw) Follow blood sugars outpatient - discharged on glipizide  alone - may need additional medication/adjustment Follow treatment H pylori, needs follow up to confirm eradication as below Adjust diuretics as needed for ascites/volume overload 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 - follow outpatient   Discharge Diagnoses:  Principal Problem:   Alcoholic hepatitis Active Problems:   Acute liver failure   ETOH abuse   Hyponatremia   Metabolic dysfunction-associated steatohepatitis (MASH)   Secondary esophageal varices without bleeding (HCC)   Portal hypertensive gastropathy (HCC)   Cirrhosis of liver without ascites (HCC)   Discharge Condition: stable  Diet recommendation: heart healthy, diabetic  Filed Weights   04/06/24 0839 04/10/24 0500 04/14/24 0430  Weight: 77 kg 65.5 kg 68.5 kg    History of present illness:   43 yo M with T2DM and etoh abuse who presented with alcoholic hepatitis.    Improving with supportive care.  GI was c/s.  SBP ruled out.  EGD showed portal hypertensive gastropathy and grade 1 varices.  He was briefly on steroids for his alcoholic hepatitis, but these were discontinued when considering his hyperglycemia and limited resources outpatient.  Dx with h pylori associated gastritis and discharged on quadruple therapy.    See below for additional details  Hospital Course:  Assessment and Plan:  Homelessness Complicates care and follow up plan.  He doesn't currently have Rickelle Sylvestre working phone.  He needs follow up appointments made before he discharges.  Will try to simplify meds  where possible.      DM Type 2  Steroid Induced Hyperglycemia A1c 7.3 Currently hyperglycemia related to steroids, these have been discontinued.  Without steroid induced hyperglycemia, I think he'll be able to do well without insulin  on oral meds.    Continue glipizide  2.5 mg BID for now    Acute Alcoholic hepatitis No encephalopathy Will discontinue steroids given his hyperglycemia and complexity of discharging him on steroids + insulin  with homelessness (discussed with GI)   Labs will need to be followed outpatient - suspect he likely has cirrhosis as well  Follow with GI outpatient    Ascites  SBP ruled out s/p 3 L fluid removal on 9/12 Spironolactone  reduced, hold lasix  for now with hypotension above - follow on spironolactone  alone for now, follow outpatient   Hypotension Cramping Borderline orthostatics, but asymptomatic Hold lasix , reduce spironolactone  - improved, follow outpatient   ? 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 Will discharge with quadruple therapy, appreciate GI recs (9/18)   1) Omeprazole  20 mg 2 times Tanner Cooper day x 14 d 2) Pepto Bismol 2 tabs (262 mg each) 4 times Tanner Cooper day x 14 d 3) Metronidazole  250 mg 4 times Tanner Cooper day x 14 d 4) doxycycline  100 mg 2 times Tanner Cooper day x 14 d  4 weeks after treatment, needs H pylori stool antigen to confirm eradication    Severe hypervolemic hyponatremia Thought related to etoh/tea/toast diet and volume overload Follow outpatient   Etoh Abuse Encourage cessation    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   EGD Impression: - The examined portions of the nasopharynx, oropharynx and larynx were  normal. - Grade I esophageal varices. Not high risk. No suspicion as bleeding source. - Z- line irregular. - Portal hypertensive gastropathy. Biopsied. - Normal examined duodenum. Moderate Sedation:   N/ Tanner Cooper Recommendation: - Return patient to hospital ward for ongoing care. - Resume previous diet. - Continue present medications. - Await pathology results. - Recommend starting carvedilol  6. 25 mg once daily for variceal bleeding prophylaxis. This can be started at discharge. - Consider starting prednisolone  for alcoholic hepatitis. Although his DF is not > 32 when the control PT time is 15, it is > 32 when Tanner Cooper control PT time of 12 is used ( our lab quotes Tanner Cooper normal prothrombin range of 11. 4- 15. 2)   Consultations: GI IR  Discharge Exam: Vitals:   04/14/24 0400 04/14/24 1300  BP: 100/74 98/68  Pulse: 71 73  Resp: 15   Temp: 98.2 F (36.8 C) 98.6 F (37 C)  SpO2: 100% 100%   No new complaints Eager to discharge if possible  General: No acute distress. jaundiced Lungs: unlabored Neurological: Alert and oriented 3. Moves all extremities 4 with equal strength. Cranial nerves II through XII grossly intact. Extremities: No clubbing or cyanosis. No edema.  Discharge Instructions   Discharge Instructions     Call MD for:  difficulty breathing, headache or visual disturbances   Complete by: As directed    Call MD for:  extreme fatigue   Complete by: As directed    Call MD for:  hives   Complete by: As directed    Call MD for:  persistant dizziness or light-headedness   Complete by: As directed    Call MD for:  persistant nausea and vomiting   Complete by: As directed    Call MD for:  redness, tenderness, or signs of infection (pain, swelling, redness, odor or green/yellow discharge around incision site)   Complete by: As directed    Call MD for:  severe uncontrolled pain   Complete by: As directed    Call MD for:  temperature >100.4   Complete by: As directed    Diet - low  sodium heart healthy   Complete by: As directed    Discharge instructions   Complete by: As directed    You were seen for alcoholic hepatitis.    Your liver tests have gradually improved, but are still abnormal.  It's imperative you avoid alcohol in the future.  Don't take tylenol  or any other over the counter drugs without discussing this with your doctor.  We stopped your steroids due to your high blood sugars.  You'll need to follow up with gastroenterology outpatient to follow your liver function and to assess whether you have cirrhosis (I think it's likely that you do).    We're sending you home with spironolactone  to help with the fluid in your belly.  You'll need repeat labs and adjustment of this medicine as an outpatient.   You were seen by gastroenterology.  You had an endoscopy that showed portal hypertensive gastropathy and small varices.  We started you on coreg  for this.    You have H pylori.  We're sending you with 14 days of omeprazole , doxycycline , pepto, and flagyl  for this.  You'll need to follow up with gastroenterology after treatment for follow up.  We've started you on glipizide  for your diabetes.  Glipizide  can put you at risk for low blood sugar, if you skip Tanner Cooper meal, you should hold your glipizide .  We tried to keep things simple, but you'll need to keep an eye on your blood sugars going forward.   Return for new, recurrent, or worsening symptoms.  Please ask your PCP to request records from this hospitalization so they know what was done and what the next steps will be.   Increase activity slowly   Complete by: As directed       Allergies as of 04/14/2024   No Known Allergies      Medication List     STOP taking these medications    esomeprazole  40 MG capsule Commonly known as: NEXIUM    ondansetron  4 MG tablet Commonly known as: ZOFRAN        TAKE these medications    Accu-Chek Guide Test test strip Generic drug: glucose blood Use in the morning,  at noon, and at bedtime. May substitute to any manufacturer covered by patient's insurance.   Accu-Chek Guide w/Device Kit Use as directed in the morning, at noon, and at bedtime. May substitute to any manufacturer covered by patient's insurance.   Accu-Chek Softclix Lancets lancets Use in the morning, at noon, and at bedtime. May substitute to any manufacturer covered by patient's insurance.   bismuth  subsalicylate 262 MG chewable tablet Commonly known as: Pepto-Bismol Chew 2 tablets (524 mg total) by mouth 2 (two) times daily for 14 days.   carvedilol  6.25 MG tablet Commonly known as: COREG  Take 1 tablet (6.25 mg total) by mouth 2 (two) times daily with Tanner Cooper meal.   doxycycline  100 MG tablet Commonly known as: VIBRA -TABS Take 1 tablet (100 mg total) by mouth 2 (two) times daily for 14 days.   glipiZIDE  5 MG tablet Commonly known as: GLUCOTROL  Take 0.5 tablets (2.5 mg total) by mouth 2 (two) times daily before Korde Jeppsen meal. If you skip Tanner Cooper meal, hold glipizide .   Lancet Device Misc 1 each by Does not apply route in the morning, at noon, and at bedtime. May substitute to any manufacturer covered by patient's insurance.   metroNIDAZOLE  250 MG tablet Commonly known as: FLAGYL  Take 1 tablet (250 mg total) by mouth 2 (two) times daily for 14 days.   omeprazole  20 MG capsule Commonly known as: PRILOSEC  Take 1 capsule (20 mg total) by mouth 2 (two) times daily for 14 days.   spironolactone  50 MG tablet Commonly known as: ALDACTONE  Tome 1 tableta (50 mg en total) por va oral diariamente. (Take 1 tablet (50 mg total) by mouth daily.) Start taking on: April 15, 2024       No Known Allergies  Follow-up Information     [facility] Follow up on 05/27/2024.   Specialty: Gastroenterology Why: You have an appointment at 9:30 am on 05/27/2024. Contact information: [address] [place]  [address] [phone]        Sherlynn Madden, MD Follow up.   Specialty: Internal Medicine Why: 04/15/24 @ 2:05  PLEASE BRING ALL CURRENT MEDICATION and ID THERE ARE NO FREE CLINIC AVAILABLE, Hospital follow up Contact information: [address] [place] [id number] [phone]                  The results of significant diagnostics from this hospitalization (including imaging, microbiology, ancillary and laboratory) are listed below for reference.    Significant Diagnostic Studies: US  Paracentesis  Result Date: 04/04/2024 INDICATION: 43 year old male who presented to the ED with abdominal distension. Abdominal ultrasound revealed cirrhotic liver and large volume ascites. Request for therapeutic paracentesis. EXAM: ULTRASOUND GUIDED for MEDICATIONS: 1% lidocaine , 9 mL. COMPLICATIONS: None immediate. PROCEDURE: Informed written consent was obtained from the patient after Tanner Cooper discussion of the risks, benefits and alternatives to treatment. Tanner Cooper timeout was performed prior to the initiation of the procedure. Initial ultrasound scanning demonstrates Tanner Cooper large amount of ascites within the right lower abdominal quadrant. The right lower abdomen was prepped and draped in the usual sterile fashion. 1% lidocaine  was used for local anesthesia. Following this, Tanner Cooper 19 gauge, 7-cm, Yueh catheter was introduced. An ultrasound image was saved for documentation purposes. The paracentesis was performed. The catheter was removed and Tanner Cooper dressing was applied. The patient tolerated the procedure well without immediate post procedural complication. Patient received post-procedure intravenous albumin ; see nursing notes for details. FINDINGS: Tanner Cooper total of approximately 3.1 L of dark yellow fluid was removed. IMPRESSION: Successful ultrasound-guided paracentesis yielding 3.1 liters of peritoneal fluid. Procedure performed by: Tanner Bal, PA-C under the supervision of Dr. JONETTA Faes. Electronically Signed   By: JONETTA Faes M.D.   On: 04/04/2024 14:22    US  Abdomen Complete Result Date: 04/03/2024 CLINICAL DATA:  Cholecystitis. EXAM: ABDOMEN ULTRASOUND COMPLETE COMPARISON:  CT abdomen and pelvis 04/03/2024. FINDINGS: Gallbladder: No gallstones identified. There is diffuse gallbladder wall thickening measuring 9 mm. No sonographic Murphy sign noted by sonographer. Common bile duct: Diameter: 5.1 mm Liver: Echogenic and coarse parenchymal echotexture. Nodular contour. Portal vein is patent on color Doppler imaging with normal direction of blood flow towards the liver. IVC: No abnormality visualized. Pancreas: Visualized portion unremarkable. Spleen: Size and appearance within normal limits. Right Kidney: Length: 11.4 cm. Echogenicity within normal limits. No mass or hydronephrosis visualized. Left Kidney: Length: 12.6 cm. Echogenicity within normal limits. No mass or hydronephrosis visualized. Abdominal aorta: No aneurysm visualized. Limited visualization secondary to bowel-gas. Other findings: There is large volume ascites, right greater than left. IMPRESSION: 1. Cirrhotic morphology of the liver. 2. Large volume ascites. 3. Diffuse gallbladder wall thickening without evidence of cholelithiasis or sonographic Murphy sign. Findings are nonspecific and may be related to underlying liver disease. Electronically Signed   By: Greig Pique M.D.   On: 04/03/2024 20:50   CT ABDOMEN PELVIS WO CONTRAST Result Date: 04/03/2024 CLINICAL DATA:  Acute abdominal pain EXAM: CT ABDOMEN AND PELVIS WITHOUT CONTRAST TECHNIQUE: Multidetector CT imaging of the abdomen and pelvis was performed following the standard protocol without IV contrast. RADIATION DOSE REDUCTION: This exam was performed according to the departmental dose-optimization program which includes automated exposure control, adjustment of the mA and/or kV according to patient size and/or use of iterative reconstruction technique. COMPARISON:  02/23/2024 FINDINGS: Lower chest: Coronary and aortic atherosclerosis.  Mildly elevated right hemidiaphragm with atelectasis along the right diaphragm as well as mild atelectasis in the lingula left lower lobe. Hepatobiliary: Striking diffuse hepatic steatosis. Slightly irregular contour of the liver, cannot exclude cirrhosis or steatohepatitis. Thick-walled gallbladder, nonspecific for inflammation versus hypoproteinemia/hypoalbuminemia. Pancreas: Unremarkable Spleen: Unremarkable Adrenals/Urinary Tract: Unremarkable Stomach/Bowel: Unremarkable Vascular/Lymphatic: Unremarkable Reproductive: Unremarkable Other: New substantial ascites.  Mild mesenteric edema. 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 Appearance unchanged. Fatty bilateral spermatic cords. IMPRESSION: 1. New substantial ascites. 2. Striking diffuse hepatic steatosis. Slightly irregular contour of the liver, cannot exclude cirrhosis or steatohepatitis. 3. Thick-walled gallbladder, nonspecific for  inflammation versus hypoproteinemia/hypoalbuminemia. 4. 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 5.  Aortic Atherosclerosis (ICD10-I70.0). Electronically Signed   By: Ryan Salvage M.D.   On: 04/03/2024 14:32    Microbiology: No results found for this or any previous visit (from the past 240 hours).   Labs: Basic Metabolic Panel: Recent Labs  Lab 04/10/24 0401 04/11/24 0539 04/12/24 0522 04/13/24 0929 04/14/24 0506  NA 134* 131* 128* 127* 130*  K 3.7 3.2* 4.0 4.9 4.4  CL 98 95* 92* 94* 96*  CO2 27 24 24 23 26   GLUCOSE 139* 156* 252* 342* 94  BUN 12 11 12 12 9   CREATININE 0.68 0.55* 0.65 0.65 0.67  CALCIUM  8.7* 8.4* 8.5* 8.5* 8.3*  MG  --   --  1.8  --   --   PHOS  --   --  4.1  --   --    Liver Function Tests: Recent Labs  Lab 04/10/24 0401 04/11/24 0539 04/12/24 0522 04/13/24 0929 04/14/24 0506  AST  91* 79* 95* 92* 88*  ALT 81* 83* 89* 85* 75*  ALKPHOS 358* 324* 371* 341* 348*  BILITOT 15.9* 12.0* 11.3* 10.9* 9.5*  PROT 5.9* 5.8* 6.0* 5.6* 5.7*  ALBUMIN  2.5* 2.4* 2.4* 2.4* 2.3*   No results for input(s): LIPASE, AMYLASE in the last 168 hours. No results for input(s): AMMONIA in the last 168 hours. CBC: Recent Labs  Lab 04/09/24 0220 04/10/24 0401 04/12/24 0522 04/13/24 0929 04/14/24 0506  WBC 11.3* 10.3 13.8* 14.0* 12.9*  NEUTROABS  --   --  10.2* 10.8* 7.5  HGB 12.7* 12.7* 12.9* 12.5* 12.2*  HCT 37.4* 37.3* 37.5* 37.2* 36.0*  MCV 103.3* 103.0* 104.2* 105.7* 104.0*  PLT 260 254 269 264 258   Cardiac Enzymes: No results for input(s): CKTOTAL, CKMB, CKMBINDEX, TROPONINI in the last 168 hours. BNP: BNP (last 3 results) No results for input(s): BNP in the last 8760 hours.  ProBNP (last 3 results) No results for input(s): PROBNP in the last 8760 hours.  CBG: Recent Labs  Lab 04/13/24 1143 04/13/24 1704 04/13/24 2153 04/14/24 0854 04/14/24 1250  GLUCAP 165* 235* 104* 294* 225*       Signed:  Meliton Monte MD.  [facility] 04/14/2024, 3:27 PM

## 2024-04-14 NOTE — Progress Notes (Signed)
 Per patient request MD on call Dr. Charlton was notified and patient c/o of itching and fine red rash located on the patient torso. MD placed orders and RN administered will continue to monitor and intervene as clinically indicated.

## 2024-04-14 NOTE — Progress Notes (Signed)
 This RN went over AVS instructions with in-person spanish interpreter at bedside. Patient verbalized understanding of all teaching. Patient verbalized understanding of his follow up appointment tomorrow. All questions answered. No PIV present.

## 2024-04-14 NOTE — Plan of Care (Signed)
 Problem: Education: Goal: Ability to describe self-care measures that may prevent or decrease complications (Diabetes Survival Skills Education) will improve 04/14/2024 0452 by Kennyth Suzen SAUNDERS, RN Outcome: Progressing 04/14/2024 0452 by Kennyth Suzen SAUNDERS, RN Outcome: Progressing 04/14/2024 0452 by Kennyth Suzen SAUNDERS, RN Outcome: Progressing Goal: Individualized Educational Video(s) 04/14/2024 0452 by Kennyth Suzen SAUNDERS, RN Outcome: Progressing 04/14/2024 0452 by Kennyth Suzen SAUNDERS, RN Outcome: Progressing 04/14/2024 0452 by Kennyth Suzen SAUNDERS, RN Outcome: Progressing   Problem: Coping: Goal: Ability to adjust to condition or change in health will improve 04/14/2024 0452 by Kennyth Suzen SAUNDERS, RN Outcome: Progressing 04/14/2024 0452 by Kennyth Suzen SAUNDERS, RN Outcome: Progressing 04/14/2024 0452 by Kennyth Suzen SAUNDERS, RN Outcome: Progressing   Problem: Fluid Volume: Goal: Ability to maintain a balanced intake and output will improve 04/14/2024 0452 by Kennyth Suzen SAUNDERS, RN Outcome: Progressing 04/14/2024 0452 by Kennyth Suzen SAUNDERS, RN Outcome: Progressing 04/14/2024 0452 by Kennyth Suzen SAUNDERS, RN Outcome: Progressing   Problem: Health Behavior/Discharge Planning: Goal: Ability to identify and utilize available resources and services will improve 04/14/2024 0452 by Kennyth Suzen SAUNDERS, RN Outcome: Progressing 04/14/2024 0452 by Kennyth Suzen SAUNDERS, RN Outcome: Progressing 04/14/2024 0452 by Kennyth Suzen SAUNDERS, RN Outcome: Progressing Goal: Ability to manage health-related needs will improve 04/14/2024 0452 by Kennyth Suzen SAUNDERS, RN Outcome: Progressing 04/14/2024 0452 by Kennyth Suzen SAUNDERS, RN Outcome: Progressing 04/14/2024 0452 by Kennyth Suzen SAUNDERS, RN Outcome: Progressing   Problem: Metabolic: Goal: Ability to maintain appropriate glucose levels will improve 04/14/2024 0452 by Kennyth Suzen SAUNDERS, RN Outcome: Progressing 04/14/2024 0452 by Kennyth Suzen SAUNDERS, RN Outcome:  Progressing 04/14/2024 0452 by Kennyth Suzen SAUNDERS, RN Outcome: Progressing   Problem: Nutritional: Goal: Maintenance of adequate nutrition will improve 04/14/2024 0452 by Kennyth Suzen SAUNDERS, RN Outcome: Progressing 04/14/2024 0452 by Kennyth Suzen SAUNDERS, RN Outcome: Progressing 04/14/2024 0452 by Kennyth Suzen SAUNDERS, RN Outcome: Progressing Goal: Progress toward achieving an optimal weight will improve 04/14/2024 0452 by Kennyth Suzen SAUNDERS, RN Outcome: Progressing 04/14/2024 0452 by Kennyth Suzen SAUNDERS, RN Outcome: Progressing 04/14/2024 0452 by Kennyth Suzen SAUNDERS, RN Outcome: Progressing   Problem: Skin Integrity: Goal: Risk for impaired skin integrity will decrease 04/14/2024 0452 by Kennyth Suzen SAUNDERS, RN Outcome: Progressing 04/14/2024 0452 by Kennyth Suzen SAUNDERS, RN Outcome: Progressing 04/14/2024 0452 by Kennyth Suzen SAUNDERS, RN Outcome: Progressing   Problem: Tissue Perfusion: Goal: Adequacy of tissue perfusion will improve 04/14/2024 0452 by Kennyth Suzen SAUNDERS, RN Outcome: Progressing 04/14/2024 0452 by Kennyth Suzen SAUNDERS, RN Outcome: Progressing 04/14/2024 0452 by Kennyth Suzen SAUNDERS, RN Outcome: Progressing   Problem: Education: Goal: Knowledge of General Education information will improve Description: Including pain rating scale, medication(s)/side effects and non-pharmacologic comfort measures 04/14/2024 0452 by Kennyth Suzen SAUNDERS, RN Outcome: Progressing 04/14/2024 0452 by Kennyth Suzen SAUNDERS, RN Outcome: Progressing 04/14/2024 0452 by Kennyth Suzen SAUNDERS, RN Outcome: Progressing   Problem: Health Behavior/Discharge Planning: Goal: Ability to manage health-related needs will improve 04/14/2024 0452 by Kennyth Suzen SAUNDERS, RN Outcome: Progressing 04/14/2024 0452 by Kennyth Suzen SAUNDERS, RN Outcome: Progressing 04/14/2024 0452 by Kennyth Suzen SAUNDERS, RN Outcome: Progressing   Problem: Clinical Measurements: Goal: Ability to maintain clinical measurements within normal limits will  improve 04/14/2024 0452 by Kennyth Suzen SAUNDERS, RN Outcome: Progressing 04/14/2024 0452 by Kennyth Suzen SAUNDERS, RN Outcome: Progressing 04/14/2024 0452 by Kennyth Suzen SAUNDERS, RN Outcome: Progressing Goal: Will remain free from infection 04/14/2024 0452 by Kennyth Suzen SAUNDERS, RN Outcome: Progressing 04/14/2024 0452 by Kennyth Suzen SAUNDERS, RN Outcome: Progressing 04/14/2024 0452 by  Kennyth Suzen SAUNDERS, RN Outcome: Progressing Goal: Diagnostic test results will improve 04/14/2024 0452 by Kennyth Suzen SAUNDERS, RN Outcome: Progressing 04/14/2024 0452 by Kennyth Suzen SAUNDERS, RN Outcome: Progressing 04/14/2024 0452 by Kennyth Suzen SAUNDERS, RN Outcome: Progressing Goal: Respiratory complications will improve 04/14/2024 0452 by Kennyth Suzen SAUNDERS, RN Outcome: Progressing 04/14/2024 0452 by Kennyth Suzen SAUNDERS, RN Outcome: Progressing 04/14/2024 0452 by Kennyth Suzen SAUNDERS, RN Outcome: Progressing Goal: Cardiovascular complication will be avoided 04/14/2024 0452 by Kennyth Suzen SAUNDERS, RN Outcome: Progressing 04/14/2024 0452 by Kennyth Suzen SAUNDERS, RN Outcome: Progressing 04/14/2024 0452 by Kennyth Suzen SAUNDERS, RN Outcome: Progressing   Problem: Activity: Goal: Risk for activity intolerance will decrease 04/14/2024 0452 by Kennyth Suzen SAUNDERS, RN Outcome: Progressing 04/14/2024 0452 by Kennyth Suzen SAUNDERS, RN Outcome: Progressing 04/14/2024 0452 by Kennyth Suzen SAUNDERS, RN Outcome: Progressing   Problem: Nutrition: Goal: Adequate nutrition will be maintained 04/14/2024 0452 by Kennyth Suzen SAUNDERS, RN Outcome: Progressing 04/14/2024 0452 by Kennyth Suzen SAUNDERS, RN Outcome: Progressing 04/14/2024 0452 by Kennyth Suzen SAUNDERS, RN Outcome: Progressing   Problem: Coping: Goal: Level of anxiety will decrease 04/14/2024 0452 by Kennyth Suzen SAUNDERS, RN Outcome: Progressing 04/14/2024 0452 by Kennyth Suzen SAUNDERS, RN Outcome: Progressing 04/14/2024 0452 by Kennyth Suzen SAUNDERS, RN Outcome: Progressing   Problem:  Elimination: Goal: Will not experience complications related to bowel motility 04/14/2024 0452 by Kennyth Suzen SAUNDERS, RN Outcome: Progressing 04/14/2024 0452 by Kennyth Suzen SAUNDERS, RN Outcome: Progressing 04/14/2024 0452 by Kennyth Suzen SAUNDERS, RN Outcome: Progressing Goal: Will not experience complications related to urinary retention 04/14/2024 0452 by Kennyth Suzen SAUNDERS, RN Outcome: Progressing 04/14/2024 0452 by Kennyth Suzen SAUNDERS, RN Outcome: Progressing 04/14/2024 0452 by Kennyth Suzen SAUNDERS, RN Outcome: Progressing   Problem: Pain Managment: Goal: General experience of comfort will improve and/or be controlled 04/14/2024 0452 by Kennyth Suzen SAUNDERS, RN Outcome: Progressing 04/14/2024 0452 by Kennyth Suzen SAUNDERS, RN Outcome: Progressing 04/14/2024 0452 by Kennyth Suzen SAUNDERS, RN Outcome: Progressing   Problem: Safety: Goal: Ability to remain free from injury will improve 04/14/2024 0452 by Kennyth Suzen SAUNDERS, RN Outcome: Progressing 04/14/2024 0452 by Kennyth Suzen SAUNDERS, RN Outcome: Progressing 04/14/2024 0452 by Kennyth Suzen SAUNDERS, RN Outcome: Progressing   Problem: Skin Integrity: Goal: Risk for impaired skin integrity will decrease 04/14/2024 0452 by Kennyth Suzen SAUNDERS, RN Outcome: Progressing 04/14/2024 0452 by Kennyth Suzen SAUNDERS, RN Outcome: Progressing 04/14/2024 0452 by Kennyth Suzen SAUNDERS, RN Outcome: Progressing

## 2024-04-15 ENCOUNTER — Ambulatory Visit: Payer: Self-pay | Admitting: Sports Medicine

## 2024-04-17 ENCOUNTER — Ambulatory Visit: Payer: Self-pay | Admitting: Adult Health

## 2024-05-04 ENCOUNTER — Encounter: Payer: Self-pay | Admitting: Family

## 2024-05-15 NOTE — Progress Notes (Signed)
   This encounter was created in error - please disregard. No show

## 2024-05-27 ENCOUNTER — Ambulatory Visit: Payer: Self-pay | Admitting: Gastroenterology

## 2024-07-24 ENCOUNTER — Encounter (HOSPITAL_COMMUNITY): Payer: Self-pay

## 2024-07-24 ENCOUNTER — Emergency Department (HOSPITAL_COMMUNITY): Payer: Self-pay

## 2024-07-24 ENCOUNTER — Emergency Department (HOSPITAL_COMMUNITY)
Admission: EM | Admit: 2024-07-24 | Discharge: 2024-07-24 | Disposition: A | Discharge status: Home / Self Care | Payer: Self-pay | Source: Home / Self Care

## 2024-07-24 ENCOUNTER — Other Ambulatory Visit: Payer: Self-pay

## 2024-07-24 DIAGNOSIS — E1165 Type 2 diabetes mellitus with hyperglycemia: Secondary | ICD-10-CM | POA: Insufficient documentation

## 2024-07-24 DIAGNOSIS — R739 Hyperglycemia, unspecified: Secondary | ICD-10-CM

## 2024-07-24 DIAGNOSIS — Z7984 Long term (current) use of oral hypoglycemic drugs: Secondary | ICD-10-CM | POA: Insufficient documentation

## 2024-07-24 DIAGNOSIS — R202 Paresthesia of skin: Secondary | ICD-10-CM | POA: Insufficient documentation

## 2024-07-24 DIAGNOSIS — R42 Dizziness and giddiness: Secondary | ICD-10-CM

## 2024-07-24 LAB — COMPREHENSIVE METABOLIC PANEL WITH GFR
ALT: 111 U/L — ABNORMAL HIGH (ref 0–44)
AST: 124 U/L — ABNORMAL HIGH (ref 15–41)
Albumin: 5 g/dL (ref 3.5–5.0)
Alkaline Phosphatase: 169 U/L — ABNORMAL HIGH (ref 38–126)
Anion gap: 15 (ref 5–15)
BUN: 18 mg/dL (ref 6–20)
CO2: 22 mmol/L (ref 22–32)
Calcium: 10.6 mg/dL — ABNORMAL HIGH (ref 8.9–10.3)
Chloride: 102 mmol/L (ref 98–111)
Creatinine, Ser: 0.7 mg/dL (ref 0.61–1.24)
GFR, Estimated: >60 mL/min
Glucose, Bld: 303 mg/dL — ABNORMAL HIGH (ref 70–99)
Potassium: 4.3 mmol/L (ref 3.5–5.1)
Sodium: 138 mmol/L (ref 135–145)
Total Bilirubin: 1.1 mg/dL (ref 0.0–1.2)
Total Protein: 8.6 g/dL — ABNORMAL HIGH (ref 6.5–8.1)

## 2024-07-24 LAB — CBG MONITORING, ED: Glucose-Capillary: 325 mg/dL — ABNORMAL HIGH (ref 70–99)

## 2024-07-24 LAB — URINALYSIS, ROUTINE W REFLEX MICROSCOPIC
Bacteria, UA: NONE SEEN
Bilirubin Urine: NEGATIVE
Glucose, UA: >=500 mg/dL — AB
Hgb urine dipstick: NEGATIVE
Ketones, ur: 5 mg/dL — AB
Leukocytes,Ua: NEGATIVE
Nitrite: NEGATIVE
Protein, ur: 100 mg/dL — AB
Specific Gravity, Urine: 1.035 — ABNORMAL HIGH (ref 1.005–1.030)
pH: 5 (ref 5.0–8.0)

## 2024-07-24 LAB — I-STAT CHEM 8, ED
BUN: 20 mg/dL (ref 6–20)
Calcium, Ion: 1.29 mmol/L (ref 1.15–1.40)
Chloride: 106 mmol/L (ref 98–111)
Creatinine, Ser: 0.6 mg/dL — ABNORMAL LOW (ref 0.61–1.24)
Glucose, Bld: 298 mg/dL — ABNORMAL HIGH (ref 70–99)
HCT: 46 % (ref 39.0–52.0)
Hemoglobin: 15.6 g/dL (ref 13.0–17.0)
Potassium: 4 mmol/L (ref 3.5–5.1)
Sodium: 140 mmol/L (ref 135–145)
TCO2: 23 mmol/L (ref 22–32)

## 2024-07-24 LAB — AMMONIA: Ammonia: 59 umol/L — ABNORMAL HIGH (ref 9–35)

## 2024-07-24 LAB — CBC
HCT: 43.7 % (ref 39.0–52.0)
Hemoglobin: 14.7 g/dL (ref 13.0–17.0)
MCH: 32.6 pg (ref 26.0–34.0)
MCHC: 33.6 g/dL (ref 30.0–36.0)
MCV: 96.9 fL (ref 80.0–100.0)
Platelets: 124 K/uL — ABNORMAL LOW (ref 150–400)
RBC: 4.51 MIL/uL (ref 4.22–5.81)
RDW: 13.3 % (ref 11.5–15.5)
WBC: 5.3 K/uL (ref 4.0–10.5)
nRBC: 0 % (ref 0.0–0.2)

## 2024-07-24 LAB — TROPONIN T, HIGH SENSITIVITY: Troponin T High Sensitivity: <15 ng/L (ref 0–19)

## 2024-07-24 MED ORDER — LACTATED RINGERS IV BOLUS
1000.0000 mL | Freq: Once | INTRAVENOUS | Status: AC
  Administered 2024-07-24: 1000 mL via INTRAVENOUS

## 2024-07-24 MED ORDER — GLIPIZIDE 5 MG PO TABS: 2.5000 mg | ORAL_TABLET | Freq: Two times a day (BID) | ORAL | 0 refills | Status: DC

## 2024-07-24 MED ORDER — MECLIZINE HCL 25 MG PO TABS
25.0000 mg | ORAL_TABLET | Freq: Once | ORAL | Status: AC
  Administered 2024-07-24: 25 mg via ORAL
  Filled 2024-07-24: qty 1

## 2024-07-24 MED ORDER — METFORMIN HCL 500 MG PO TABS: 500.0000 mg | ORAL_TABLET | Freq: Two times a day (BID) | ORAL | 0 refills | Status: AC

## 2024-07-24 MED ORDER — METFORMIN HCL 500 MG PO TABS
500.0000 mg | ORAL_TABLET | Freq: Two times a day (BID) | ORAL | 0 refills | Status: DC
  Filled 2024-07-24: qty 60, 30d supply, fill #0

## 2024-07-24 NOTE — ED Triage Notes (Addendum)
 Patient reports fatigue and insomnia for 2 months, went to work today for the first time and wasn't able to finish shift because he became lightheaded and had heart palpitations. Patient also reports he sleeps for a little bit at night and then is unable to go back to sleep. Denies cardiac hx and other illness, endorses chills. 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

## 2024-07-24 NOTE — Discharge Instructions (Signed)
 Your work today was reassuring.  Please at least pick up the metformin which is a medication for diabetes.  This should cost $9 for a 1 month supply at [facility].  I have also sent the prescription to our pharmacy here at [facility] so he may call Monday to see if this is cheaper.  Think this is likely the cause of your symptoms.  Please call to try to get your Medicaid established.  Return to the ER for worsening symptoms.

## 2024-07-24 NOTE — ED Provider Notes (Signed)
 " [place] EMERGENCY DEPARTMENT AT [facility] Provider Note   CSN: [id number] Arrival date & time: 07/24/24  1054     Patient presents with: Fatigue and Insomnia   Tanner Cooper is a 44 y.o. male.   44 year old male with past medical history of diabetes and cirrhosis presenting to the emergency department today with dizziness.  The patient states that he has been feeling unwell now for the past week or 2.  He states that he has been having some intermittent dizziness during this time.  He went back to work today and was feeling dizzy.  Despite using our Spanish interpreter it is difficult to discern whether this is due to disequilibrium or lightheadedness.  The patient denies any associated chest pain.  He states that he does feel unsteady on his feet.  He has not passed out.  Denies any blood in the stool or dark stools.  The patient denies any chest pain.  He states that his Medicaid has not gone through so he has not been taking anything for his diabetes.  Does report some polyuria and polydipsia as well as some discomfort in numbness/tingling in his feet bilaterally.  He denies any focal/unilateral weakness or numbness.   Insomnia       Prior to Admission medications  Medication Sig Start Date End Date Taking? Authorizing Provider  Blood Glucose Monitoring Suppl (BLOOD GLUCOSE MONITOR SYSTEM) w/Device KIT Use as directed in the morning, at noon, and at bedtime. May substitute to any manufacturer covered by patient's insurance. 04/14/24   Perri DELENA Meliton Mickey., MD  carvedilol  (COREG ) 6.25 MG tablet Take 1 tablet (6.25 mg total) by mouth 2 (two) times daily with a meal. 04/14/24 07/13/24  Perri DELENA Meliton Mickey., MD  metFORMIN (GLUCOPHAGE) 500 MG tablet Take 1 tablet (500 mg total) by mouth 2 (two) times daily with a meal. 07/24/24   Ula Prentice SAUNDERS, MD  omeprazole  (PRILOSEC ) 20 MG capsule Take 1 capsule (20 mg total) by mouth 2 (two) times daily for 14 days. 04/14/24 04/28/24   Perri DELENA Meliton Mickey., MD  spironolactone  (ALDACTONE ) 50 MG tablet Take 1 tablet (50 mg total) by mouth daily. 04/15/24 05/15/24  Perri DELENA Meliton Mickey., MD    Allergies: Patient has no known allergies.    Review of Systems  Neurological:  Positive for weakness and numbness.  Psychiatric/Behavioral:  The patient has insomnia.   All other systems reviewed and are negative.   Updated Vital Signs BP (!) 159/97 (BP Location: Right Arm)   Pulse (!) 53   Temp 98.7 F (37.1 C) (Oral)   Resp 15   Ht 5' 4 (1.626 m)   Wt 68 kg   SpO2 100%   BMI 25.75 kg/m   Physical Exam Vitals and nursing note reviewed.   Gen: NAD Eyes: PERRL, EOMI HEENT: no oropharyngeal swelling Neck: trachea midline Resp: clear to auscultation bilaterally Card: RRR, no murmurs, rubs, or gallops Abd: nontender, nondistended Extremities: no calf tenderness, no edema Vascular: 2+ radial pulses bilaterally, 2+ DP pulses bilaterally Skin: no rashes Psyc: acting appropriately   (all labs ordered are listed, but only abnormal results are displayed) Labs Reviewed  CBC - Abnormal; Notable for the following components:      Result Value   Platelets 124 (*)    All other components within normal limits  URINALYSIS, ROUTINE W REFLEX MICROSCOPIC - Abnormal; Notable for the following components:   Specific Gravity, Urine 1.035 (*)    Glucose,  UA >=500 (*)    Ketones, ur 5 (*)    Protein, ur 100 (*)    All other components within normal limits  AMMONIA - Abnormal; Notable for the following components:   Ammonia 59 (*)    All other components within normal limits  COMPREHENSIVE METABOLIC PANEL WITH GFR - Abnormal; Notable for the following components:   Glucose, Bld 303 (*)    Calcium  10.6 (*)    Total Protein 8.6 (*)    AST 124 (*)    ALT 111 (*)    Alkaline Phosphatase 169 (*)    All other components within normal limits  CBG MONITORING, ED - Abnormal; Notable for the following components:    Glucose-Capillary 325 (*)    All other components within normal limits  I-STAT CHEM 8, ED - Abnormal; Notable for the following components:   Creatinine, Ser 0.60 (*)    Glucose, Bld 298 (*)    All other components within normal limits  TROPONIN T, HIGH SENSITIVITY  TROPONIN T, HIGH SENSITIVITY    EKG: EKG Interpretation Date/Time:  Friday July 24 2024 12:18:53 EST Ventricular Rate:  75 PR Interval:  136 QRS Duration:  84 QT Interval:  372 QTC Calculation: 415 R Axis:   63  Text Interpretation: Normal sinus rhythm with sinus arrhythmia  T wave inversions in inferior leads When compared with ECG of 07-Nov-2019 15:37, Similar morphology with slower rate Normal intervals,  nonspecific ST changes Confirmed by Ula Barter [phone]) on 07/24/2024 3:25:48 PM  Radiology: MR BRAIN WO CONTRAST Result Date: 07/24/2024 EXAM: MRI BRAIN WITHOUT CONTRAST 07/24/2024 05:25:15 PM TECHNIQUE: Multiplanar multisequence MRI of the head/brain was performed without the administration of intravenous contrast. COMPARISON: None available. CLINICAL HISTORY: Neuro deficit, acute, stroke suspected; Dizziness, gait instability. FINDINGS: BRAIN AND VENTRICLES: No acute infarct. No intracranial hemorrhage. No mass. No midline shift. No hydrocephalus. A mega cisterna magna is incidentally noted. The sella is unremarkable. Normal flow voids. ORBITS: No acute abnormality. SINUSES AND MASTOIDS: No acute abnormality. BONES AND SOFT TISSUES: Normal marrow signal. No acute soft tissue abnormality. IMPRESSION: 1. No acute intracranial abnormality. Electronically signed by: Lonni Necessary MD 07/24/2024 06:12 PM EST RP Workstation: [id number]     Procedures   Medications Ordered in the ED  lactated ringers  bolus 1,000 mL (0 mLs Intravenous Stopped 07/24/24 2019)  meclizine (ANTIVERT) tablet 25 mg (25 mg Oral Given 07/24/24 1624)  lactated ringers  bolus 1,000 mL (0 mLs Intravenous Stopped 07/24/24 2019)                                     Medical Decision Making 44 year old male with past medical history of diabetes and cirrhosis presenting to the emergency department today with disequilibrium.  I will further evaluate the patient here with basic labs as well as an EKG and keep patient on the cardiac monitor.  The patient is slightly unsteady on his gait and does have some mild disequilibrium on finger-to-nose testing.  Will obtain MRI to evaluate for CVA although all the symptoms could be due to poorly controlled diabetes as he is having what sounds like neuropathy the with polyuria and polydipsia.  Will give patient IV fluids and reevaluate for ultimate disposition.  The patient's labs are reassuring.  Anion gap is normal and does not appear to be in DKA.  Patient given IV fluids here.  MRI is negative for any acute stroke.  The patient is otherwise hemodynamically stable here.  He did talk to social work and he does think that he can work on applying for [employer] as an outpatient.  Looking on prices medications I have switched his diabetes medications from glipizide  to metformin since this is more cost effective until he is able to have his Medicaid activated.  I will send this to our pharmacy here at [place] as well as [facility] which looking online appears to provide the prescription for $9 for a 1 month supply.  I think he is stable for discharge.  Amount and/or Complexity of Data Reviewed Labs: ordered. Radiology: ordered.  Risk Prescription drug management.        Final diagnoses:  Lightheadedness  Hyperglycemia    ED Discharge Orders          Ordered    glipiZIDE  (GLUCOTROL ) 5 MG tablet  2 times daily before meals,   Status:  Discontinued        07/24/24 2023    metFORMIN (GLUCOPHAGE) 500 MG tablet  2 times daily with meals,   Status:  Discontinued        07/24/24 2026    metFORMIN (GLUCOPHAGE) 500 MG tablet  2 times daily with meals        07/24/24 2032               Ula Prentice SAUNDERS,  MD 07/24/24 2032  "

## 2024-07-24 NOTE — ED Provider Triage Note (Addendum)
 Emergency Medicine Provider Triage Evaluation Note  Tanner Cooper , a 44 y.o. male  was evaluated in triage.  Pt complains of not feeling well, reports sweats, feeling faint and weak. Onset 2+ months ago. Goes to sleep, wakes to go to the bathroom and feels weak in the hands and feet.  Came today because he started working today and 3 hours into his shift he had to stop due to feeling tired and dizzy, heart felt like it was racing. 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 Feels like the area was well ventilated. Sat down and symptoms resolved. Symptoms have since improved.   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 Review of Systems  Positive: Palpitations, fatigue Negative: Chest pain  Physical Exam  BP (!) 145/98 (BP Location: Right Arm)   Pulse 78   Temp 99 F (37.2 C) (Oral)   Resp 15   Ht 5' 4 (1.626 m)   Wt 68 kg   SpO2 98%   BMI 25.75 kg/m  Gen:   Awake, no distress   Resp:  Normal effort  MSK:   Moves extremities without difficulty  Other:    Medical Decision Making  Medically screening exam initiated at 12:10 PM.  Appropriate orders placed.  [facility] was informed that the remainder of the evaluation will be completed by another provider, this initial triage assessment does not replace that evaluation, and the importance of remaining in the ED until their evaluation is complete.     Beverley Leita LABOR, PA-C 07/24/24 1214    Beverley Leita LABOR, PA-C 07/24/24 1214

## 2024-07-24 NOTE — Progress Notes (Signed)
 TOC consult for Needs help applying to Medicaid received. Met with patient at bedside. Interpreter was utilized. Patient states someone from [facility] hospital met with him and completed paperwork for [place] app several months ago while hospitalized. After discharging, patient contacted DSS to check on app status. DSS stated they had no record of patient's information and he has, now months later, not received anything in the mail related to Medicaid. He states he would be able to apply online and would like to try to do it himself. Patient was provided with Spanish-language [place] Medicaid informational's, how-to's, and examples of applications. Reviewed that he can apply online, in-person at DSS, or via phone. Collaborated with CM on providing MATCH card. Patient w/o further needs.

## 2024-07-24 NOTE — ED Notes (Signed)
 Patient transported to MRI

## 2024-07-24 NOTE — TOC CM/SW Note (Addendum)
 TOC consult received for medication assistance, MATCH voucher can be given at d/c as patient is uninsured. SW aware of lack of insurance.   Merilee Batty, MSN, RN Case Management [phone]   [phone]: Match form completed. Sent via Epic to [facility]. Copy to be given to patient by SW.  MATCH MEDICATION ASSISTANCE CARD Pharmacies please call [phone] for claim processing assistance.  Rx BIN: [id number] Rx Group: [id number] Rx PCN: [id number] Relationship Code: 1 Person Code: 01  Patient ID (MRN): [id number]   Patient Name: Tanner Cooper  Patient DOB: 1981-04-30   Discharge Date: 07/24/2024  Expiration Date: 07/31/2024 (must be filled within 7 days of discharge)

## 2024-07-25 ENCOUNTER — Other Ambulatory Visit (HOSPITAL_COMMUNITY): Payer: Self-pay
